# Patient Record
Sex: Female | Born: 1940 | Race: White | Hispanic: No | Marital: Married | Smoking: Never smoker
Health system: Southern US, Community
[De-identification: ages and names within clinical notes are randomized; demographics above are authoritative.]

## PROBLEM LIST (undated history)

## (undated) DIAGNOSIS — I10 Essential (primary) hypertension: Secondary | ICD-10-CM

## (undated) HISTORY — DX: Essential (primary) hypertension: I10

---

## 1997-10-30 ENCOUNTER — Ambulatory Visit: Admission: RE | Admit: 1997-10-30 | Discharge: 1997-10-30 | Payer: Self-pay | Admitting: Plastic Surgery

## 1998-09-14 HISTORY — PX: FOOT TENDON SURGERY: SHX958

## 1999-01-16 ENCOUNTER — Other Ambulatory Visit: Admission: RE | Admit: 1999-01-16 | Discharge: 1999-01-16 | Payer: Self-pay | Admitting: Obstetrics and Gynecology

## 2000-02-02 ENCOUNTER — Encounter: Payer: Self-pay | Admitting: Obstetrics and Gynecology

## 2000-02-02 ENCOUNTER — Encounter: Admission: RE | Admit: 2000-02-02 | Discharge: 2000-02-02 | Payer: Self-pay | Admitting: Obstetrics and Gynecology

## 2000-05-03 ENCOUNTER — Other Ambulatory Visit: Admission: RE | Admit: 2000-05-03 | Discharge: 2000-05-03 | Payer: Self-pay | Admitting: Obstetrics and Gynecology

## 2001-07-19 ENCOUNTER — Other Ambulatory Visit: Admission: RE | Admit: 2001-07-19 | Discharge: 2001-07-19 | Payer: Self-pay | Admitting: Obstetrics and Gynecology

## 2001-07-26 ENCOUNTER — Encounter: Admission: RE | Admit: 2001-07-26 | Discharge: 2001-07-26 | Payer: Self-pay | Admitting: Obstetrics and Gynecology

## 2001-07-26 ENCOUNTER — Encounter: Payer: Self-pay | Admitting: Obstetrics and Gynecology

## 2004-07-15 ENCOUNTER — Encounter: Admission: RE | Admit: 2004-07-15 | Discharge: 2004-07-15 | Payer: Self-pay | Admitting: Internal Medicine

## 2006-07-01 ENCOUNTER — Encounter: Admission: RE | Admit: 2006-07-01 | Discharge: 2006-07-01 | Payer: Self-pay | Admitting: Dermatology

## 2006-07-28 ENCOUNTER — Encounter: Admission: RE | Admit: 2006-07-28 | Discharge: 2006-07-28 | Payer: Self-pay | Admitting: Internal Medicine

## 2007-04-08 ENCOUNTER — Ambulatory Visit: Payer: Self-pay | Admitting: Internal Medicine

## 2007-05-23 ENCOUNTER — Ambulatory Visit: Payer: Self-pay | Admitting: Internal Medicine

## 2007-05-23 ENCOUNTER — Encounter: Payer: Self-pay | Admitting: Internal Medicine

## 2007-05-23 DIAGNOSIS — D126 Benign neoplasm of colon, unspecified: Secondary | ICD-10-CM

## 2007-12-29 DIAGNOSIS — K589 Irritable bowel syndrome without diarrhea: Secondary | ICD-10-CM

## 2009-04-12 ENCOUNTER — Encounter: Admission: RE | Admit: 2009-04-12 | Discharge: 2009-04-12 | Payer: Self-pay | Admitting: Internal Medicine

## 2010-12-24 ENCOUNTER — Other Ambulatory Visit: Payer: Self-pay | Admitting: Dermatology

## 2011-01-21 ENCOUNTER — Other Ambulatory Visit: Payer: Self-pay | Admitting: Dermatology

## 2011-01-27 NOTE — Assessment & Plan Note (Signed)
[facility]                         GASTROENTEROLOGY OFFICE NOTE   NAME:Victoria Hanna, Victoria Hanna                        MRN:          [id number]  DATE:04/08/2007                            DOB:          1941-09-05    REFERRING PHYSICIAN:  Loraine Leriche A. Perini, M.D.   OFFICE CONSULTATION NOTE   REASON FOR CONSULTATION:  Chronic abdominal complaints and screening  colonoscopy.   HISTORY:  This is a 70 year old white female with no significant past  medical history who was referred through the courtesy of Dr. Waynard Edwards  regarding chronic abdominal complaints and screening colonoscopy.  The  patient reports a history of irritable bowel syndrome.  She describes  intolerance to lactose and wheat products.  With lactose products, she  describes significant abdominal discomfort and bloating.  She denies  significant constipation or diarrhea, though she has a tendency towards  loose stools.  She also feels that she is intolerant to wheat.  She has  not been formally tested to exclude celiac disease with similar-type  symptoms.  No nausea or vomiting, melena, hematochezia, or weight loss.   PAST MEDICAL HISTORY:  None.   PAST SURGICAL HISTORY:  Left foot surgery.   ALLERGIES:  No known drug allergies.   CURRENT MEDICATIONS:  1. Multivitamin.  2. Cosamin DS daily.  3. Tylenol p.r.n.  4. Lactaid p.r.n.   FAMILY HISTORY:  Negative for gastrointestinal malignancy.   SOCIAL HISTORY:  The patient is married with 2 children.  She has  college degree.  She works in Airline pilot for [employer].  Does not  smoke.  Drinks 3 to 4 glasses of red wine per week.   REVIEW OF SYSTEMS:  Per diagnostic evaluation form.   PHYSICAL EXAMINATION:  Well-appearing female in no acute distress.  Blood pressure is 120/72, heart rate is 64, weight is 128 pounds.  She  is 5 feet 2 inches in height.  HEENT:  Sclerae are anicteric.  Conjunctivae pink.  Oral mucosa is  intact.  No  adenopathy.  LUNGS:  Clear.  HEART:  Regular.  ABDOMEN:  Soft without tenderness, mass, or hernia.  Good bowel sounds  heard.  EXTREMITIES:  Without edema.   LABORATORY:  The laboratories from Dr. Laurey Morale office March 07, 2007  revealed mild elevation of hepatic transaminases.  CBC was normal.  Cholesterol was mildly elevated.   IMPRESSION:  This is a 70 year old female with no significant past  medical history who presented today regarding chronic stable abdominal  complaints and screening colonoscopy.  With regard to her abdominal  complaints, I agree that she has an irritable bowel-type picture, though  she may indeed have a component of lactose intolerance.  She should also  be screened for celiac sprue.  No worrisome feature to her history  otherwise.  In terms of colonoscopy, she is an appropriate candidate  without contraindication.   RECOMMENDATIONS:  1. Celiac sprue panel.  2. Colonoscopy.  The nature of the procedure as well as the risks,      benefits, and alternatives were discussed in detail.  She  understood and agreed to proceed.  She preferred the OsmoPrep.  I      advised her with regards to the importance of vigorous hydration.     Wilhemina Bonito. Marina Goodell, MD  Electronically Signed    JNP/MedQ  DD: 04/08/2007  DT: 04/09/2007  Job #: [id number]   cc:   Loraine Leriche A. Perini, M.D.

## 2011-04-01 ENCOUNTER — Other Ambulatory Visit: Payer: Self-pay | Admitting: Dermatology

## 2011-05-06 ENCOUNTER — Other Ambulatory Visit: Payer: Self-pay | Admitting: Dermatology

## 2011-05-28 ENCOUNTER — Other Ambulatory Visit: Payer: Self-pay | Admitting: Dermatology

## 2012-03-29 ENCOUNTER — Other Ambulatory Visit (HOSPITAL_COMMUNITY): Payer: Self-pay | Admitting: Orthopedic Surgery

## 2012-03-29 DIAGNOSIS — S92902A Unspecified fracture of left foot, initial encounter for closed fracture: Secondary | ICD-10-CM

## 2012-04-04 ENCOUNTER — Other Ambulatory Visit (HOSPITAL_COMMUNITY): Payer: Self-pay

## 2012-04-04 ENCOUNTER — Encounter (HOSPITAL_COMMUNITY): Payer: Self-pay

## 2012-04-07 ENCOUNTER — Encounter (HOSPITAL_COMMUNITY)
Admission: RE | Admit: 2012-04-07 | Discharge: 2012-04-07 | Disposition: A | Discharge status: Home / Self Care | Payer: Medicare Other | Source: Ambulatory Visit | Attending: Orthopedic Surgery | Admitting: Orthopedic Surgery

## 2012-04-07 DIAGNOSIS — L97509 Non-pressure chronic ulcer of other part of unspecified foot with unspecified severity: Secondary | ICD-10-CM | POA: Insufficient documentation

## 2012-04-07 DIAGNOSIS — S92902A Unspecified fracture of left foot, initial encounter for closed fracture: Secondary | ICD-10-CM

## 2012-04-07 MED ORDER — TECHNETIUM TC 99M MEDRONATE IV KIT
24.0000 | PACK | Freq: Once | INTRAVENOUS | Status: AC | PRN
  Administered 2012-04-07: 24 via INTRAVENOUS

## 2012-05-19 ENCOUNTER — Ambulatory Visit: Payer: Medicare Other | Admitting: Infectious Disease

## 2012-10-25 ENCOUNTER — Other Ambulatory Visit: Payer: Self-pay | Admitting: Internal Medicine

## 2012-10-25 DIAGNOSIS — N949 Unspecified condition associated with female genital organs and menstrual cycle: Secondary | ICD-10-CM

## 2012-11-08 ENCOUNTER — Other Ambulatory Visit: Payer: Medicare Other

## 2012-11-15 ENCOUNTER — Ambulatory Visit
Admission: RE | Admit: 2012-11-15 | Discharge: 2012-11-15 | Disposition: A | Discharge status: Home / Self Care | Payer: Medicare Other | Source: Ambulatory Visit | Attending: Internal Medicine | Admitting: Internal Medicine

## 2013-05-31 ENCOUNTER — Other Ambulatory Visit: Payer: Self-pay | Admitting: Dermatology

## 2013-08-15 ENCOUNTER — Other Ambulatory Visit: Payer: Self-pay

## 2013-08-15 DIAGNOSIS — Z1231 Encounter for screening mammogram for malignant neoplasm of breast: Secondary | ICD-10-CM

## 2013-09-19 ENCOUNTER — Ambulatory Visit
Admission: RE | Admit: 2013-09-19 | Discharge: 2013-09-19 | Disposition: A | Discharge status: Home / Self Care | Payer: Medicare Other | Source: Ambulatory Visit

## 2013-09-19 DIAGNOSIS — Z1231 Encounter for screening mammogram for malignant neoplasm of breast: Secondary | ICD-10-CM

## 2015-01-31 ENCOUNTER — Other Ambulatory Visit: Payer: Self-pay

## 2015-01-31 DIAGNOSIS — Z1231 Encounter for screening mammogram for malignant neoplasm of breast: Secondary | ICD-10-CM

## 2015-03-05 ENCOUNTER — Ambulatory Visit
Admission: RE | Admit: 2015-03-05 | Discharge: 2015-03-05 | Disposition: A | Discharge status: Home / Self Care | Payer: Medicare Other | Source: Ambulatory Visit

## 2015-03-05 DIAGNOSIS — Z1231 Encounter for screening mammogram for malignant neoplasm of breast: Secondary | ICD-10-CM

## 2016-04-14 ENCOUNTER — Other Ambulatory Visit: Payer: Self-pay | Admitting: Internal Medicine

## 2016-04-14 DIAGNOSIS — Z1231 Encounter for screening mammogram for malignant neoplasm of breast: Secondary | ICD-10-CM

## 2016-06-09 ENCOUNTER — Ambulatory Visit
Admission: RE | Admit: 2016-06-09 | Discharge: 2016-06-09 | Disposition: A | Discharge status: Home / Self Care | Payer: Medicare Other | Source: Ambulatory Visit | Attending: Internal Medicine | Admitting: Internal Medicine

## 2016-06-09 DIAGNOSIS — Z1231 Encounter for screening mammogram for malignant neoplasm of breast: Secondary | ICD-10-CM

## 2017-06-21 ENCOUNTER — Encounter: Payer: Self-pay | Admitting: Internal Medicine

## 2017-12-13 ENCOUNTER — Other Ambulatory Visit: Payer: Self-pay | Admitting: Internal Medicine

## 2017-12-13 DIAGNOSIS — Z1231 Encounter for screening mammogram for malignant neoplasm of breast: Secondary | ICD-10-CM

## 2018-01-18 ENCOUNTER — Ambulatory Visit
Admission: RE | Admit: 2018-01-18 | Discharge: 2018-01-18 | Disposition: A | Discharge status: Home / Self Care | Payer: Medicare Other | Source: Ambulatory Visit | Attending: Internal Medicine | Admitting: Internal Medicine

## 2018-01-18 DIAGNOSIS — Z1231 Encounter for screening mammogram for malignant neoplasm of breast: Secondary | ICD-10-CM

## 2018-01-19 ENCOUNTER — Other Ambulatory Visit: Payer: Self-pay | Admitting: Internal Medicine

## 2018-01-19 DIAGNOSIS — R928 Other abnormal and inconclusive findings on diagnostic imaging of breast: Secondary | ICD-10-CM

## 2018-01-25 ENCOUNTER — Ambulatory Visit
Admission: RE | Admit: 2018-01-25 | Discharge: 2018-01-25 | Disposition: A | Discharge status: Home / Self Care | Payer: Medicare Other | Source: Ambulatory Visit | Attending: Internal Medicine | Admitting: Internal Medicine

## 2018-01-25 ENCOUNTER — Other Ambulatory Visit: Payer: Self-pay | Admitting: Internal Medicine

## 2018-01-25 DIAGNOSIS — R928 Other abnormal and inconclusive findings on diagnostic imaging of breast: Secondary | ICD-10-CM

## 2018-01-25 DIAGNOSIS — N6320 Unspecified lump in the left breast, unspecified quadrant: Secondary | ICD-10-CM

## 2018-01-28 ENCOUNTER — Other Ambulatory Visit: Payer: Self-pay | Admitting: Internal Medicine

## 2018-01-28 DIAGNOSIS — N6320 Unspecified lump in the left breast, unspecified quadrant: Secondary | ICD-10-CM

## 2018-01-31 ENCOUNTER — Ambulatory Visit
Admission: RE | Admit: 2018-01-31 | Discharge: 2018-01-31 | Disposition: A | Discharge status: Home / Self Care | Payer: Medicare Other | Source: Ambulatory Visit | Attending: Internal Medicine | Admitting: Internal Medicine

## 2018-01-31 DIAGNOSIS — N6320 Unspecified lump in the left breast, unspecified quadrant: Secondary | ICD-10-CM

## 2018-02-01 ENCOUNTER — Telehealth: Payer: Self-pay | Admitting: Hematology

## 2018-02-01 NOTE — Telephone Encounter (Signed)
Spoke with patient to confirm afternoon [facility] appointment for 5/29, packet will be emailed to patient

## 2018-02-03 ENCOUNTER — Encounter: Payer: Self-pay | Admitting: *Deleted

## 2018-02-04 ENCOUNTER — Other Ambulatory Visit: Payer: Self-pay | Admitting: *Deleted

## 2018-02-04 DIAGNOSIS — Z170 Estrogen receptor positive status [ER+]: Principal | ICD-10-CM

## 2018-02-04 DIAGNOSIS — C50212 Malignant neoplasm of upper-inner quadrant of left female breast: Secondary | ICD-10-CM

## 2018-02-08 ENCOUNTER — Other Ambulatory Visit: Payer: Self-pay | Admitting: General Surgery

## 2018-02-08 DIAGNOSIS — C50212 Malignant neoplasm of upper-inner quadrant of left female breast: Secondary | ICD-10-CM

## 2018-02-08 DIAGNOSIS — Z170 Estrogen receptor positive status [ER+]: Principal | ICD-10-CM

## 2018-02-08 NOTE — Progress Notes (Signed)
[place]  Telephone:([phone] Fax:([phone]) [place] Note   Patient Care Team: Crist Infante, MD as PCP - General (Internal Medicine) Eppie Gibson, MD as Attending Physician (Radiation Oncology) Truitt Merle, MD as Consulting Physician (Hematology)   Date of Service:   02/09/2018  CHIEF COMPLAINTS/PURPOSE OF CONSULTATION:  Malignant neoplasm of upper-inner quadrant of left breast   Oncology History   Cancer Staging Malignant neoplasm of upper-inner quadrant of left breast in female, estrogen receptor positive ([place]) Staging form: Breast, AJCC 8th Edition - Clinical stage from 01/31/2018: Stage IA (cT1c, cN0, cM0, G1, ER+, PR+, HER2-) - Signed by Truitt Merle, MD on 02/09/2018       Malignant neoplasm of upper-inner quadrant of left breast in female, estrogen receptor positive ([place])   01/25/2018 Mammogram    Diagnostic Mammogram 01/25/18  IMPRESSION:  Suspicious irregular hypoechoic mass in the left breast at 11:30 5 cm from the nipple measuring 1.2 x 1.1 x 1.1 cm.    RECOMMENDATION: Ultrasound-guided core biopsy of the mass in the 11:30 region of the left breast is recommended. The ultrasound-guided core biopsy will be scheduled at the patient's convenience       01/31/2018 Initial Biopsy     Diagnosis 01/31/18  Breast, left, needle core biopsy, 11:30 o'clock - INVASIVE DUCTAL CARICNOMA. - SEE COMMENT. Microscopic Comment The carcinoma appears grade I. A breast prognostic profile will be performed and the results reported separately. The results were called to [facility] on 02/01/18. (JBK:gt, 02/01/18)       01/31/2018 Receptors her2    HER2 Negative ER 100% positive PR 70% positive  Ki67 15%      01/31/2018 Cancer Staging    Staging form: Breast, AJCC 8th Edition - Clinical stage from 01/31/2018: Stage IA (cT1c, cN0, cM0, G1, ER+, PR+, HER2-) - Signed by Truitt Merle, MD on 02/09/2018      02/04/2018 Initial  Diagnosis    Malignant neoplasm of upper-inner quadrant of left breast in female, estrogen receptor positive ([place])       HISTORY OF PRESENTING ILLNESS: 02/09/18  Victoria Hanna 77 y.o. female is a here because of newly diagnosed left breast cancer. The patient presents to the Breast Clinic today by herself.   This was discovered by screening mammogram. She did not feel the lump herself. She has lumpy breast tissues and may often get called back for follow up scans, however she did not have a biopsy prior to this. She denies breast change, nipple bleeding or discharge.    Today she notes she was contacted over the phone yesterday in [place] and asked questions regarding her diagnosis and current health. She over all feels well and healthy.  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 She is very active. She has 2 daughters. She notes she is divorced. Her first husband died in his 46s from colon cancer. She drinks wine moderately. She is a non-smoker.   She is overall very healthy. She had a tendon separate in her left foot in the past and had 1 surgery to repair this at [facility] in 2000. Her daughter had breast cancer at age 67 in [place] and was treated and cured. Patient keeps up with her regular screenings. She takes Micardis for HTN. She had menopause in her late 53s with little difficulty and was complete at 47. She took hormonal replacements before she started menopause.    GYN HISTORY  Menarchal: 12/13 LMP:  late 72s to age 30yo Contraceptive: NA HRT: 7-8 years, started before menopause G2P2: 2 daughters, breast feeding was short term    MEDICAL HISTORY:  Past Medical History:  Diagnosis Date  . Hypertension     SURGICAL HISTORY: Past Surgical History:  Procedure Laterality Date  . FOOT TENDON SURGERY Left 2000    SOCIAL HISTORY: Social History   Socioeconomic History  . Marital status: Divorced    Spouse name: Not on file  . Number of children: Not on file    . Years of education: Not on file  . Highest education level: Not on file  Occupational History  . Not on file  Social Needs  . Financial resource strain: Not on file  . Food insecurity:    Worry: Not on file    Inability: Not on file  . Transportation needs:    Medical: Not on file    Non-medical: Not on file  Tobacco Use  . Smoking status: Never Smoker  . Smokeless tobacco: Never Used  Substance and Sexual Activity  . Alcohol use: Yes    Alcohol/week: 3.0 oz    Types: 5 Glasses of wine per week  . Drug use: Not on file  . Sexual activity: Not on file  Lifestyle  . Physical activity:    Days per week: Not on file    Minutes per session: Not on file  . Stress: Not on file  Relationships  . Social connections:    Talks on phone: Not on file    Gets together: Not on file    Attends religious service: Not on file    Active member of club or organization: Not on file    Attends meetings of clubs or organizations: Not on file    Relationship status: Not on file  . Intimate partner violence:    Fear of current or ex partner: Not on file    Emotionally abused: Not on file    Physically abused: Not on file    Forced sexual activity: Not on file  Other Topics Concern  . Not on file  Social History Narrative  . Not on file    FAMILY HISTORY: Family History  Problem Relation Age of Onset  . Breast cancer Daughter 54    ALLERGIES:  has No Known Allergies.  MEDICATIONS:  Current Outpatient Medications  Medication Sig Dispense Refill  . telmisartan (MICARDIS) 20 MG tablet Take 10 mg by mouth daily.     No current facility-administered medications for this visit.     REVIEW OF SYSTEMS:   Constitutional: Denies fevers, chills or abnormal night sweats Eyes: Denies blurriness of vision, double vision or watery eyes Ears, nose, mouth, throat, and face: Denies mucositis or sore throat Respiratory: Denies cough, dyspnea or wheezes Cardiovascular: Denies palpitation,  chest discomfort or lower extremity swelling Gastrointestinal: Denies nausea, heartburn or change in bowel habits Skin: Denies abnormal skin rashes Lymphatics: Denies new lymphadenopathy or easy bruising Neurological:Denies numbness, tingling or new weaknesses Breast: (+) Mild left breast tenderness from biopsy Behavioral/Psych: Mood is stable, no new changes  All other systems were reviewed with the patient and are negative.  PHYSICAL EXAMINATION: ECOG PERFORMANCE STATUS: 0 - Asymptomatic  Vitals:   02/09/18 1334  BP: (!) 150/75  Pulse: 71  Resp: 18  Temp: 98.1 F (36.7 C)  SpO2: 100%   Filed Weights   02/09/18 1334  Weight: 115 lb 9.6 oz (52.4 kg)    GENERAL:alert, no distress and comfortable SKIN:  skin color, texture, turgor are normal, no rashes or significant lesions EYES: normal, conjunctiva are pink and non-injected, sclera clear OROPHARYNX:no exudate, no erythema and lips, buccal mucosa, and tongue normal  NECK: supple, thyroid normal size, non-tender, without nodularity LYMPH:  no palpable lymphadenopathy in the cervical, axillary or inguinal LUNGS: clear to auscultation and percussion with normal breathing effort HEART: regular rate & rhythm and no murmurs and no lower extremity edema ABDOMEN:abdomen soft, non-tender and normal bowel sounds Musculoskeletal:no cyanosis of digits and no clubbing  PSYCH: alert & oriented x 3 with fluent speech NEURO: no focal motor/sensory deficits BREAST: (+) mild skin ecchymosis at biopsy site, mild tenderness. No palpable lump or adenopathy of left or right breast or axilla   LABORATORY DATA:  I have reviewed the data as listed CBC Latest Ref Rng & Units 02/09/2018  WBC 3.9 - 10.3 K/uL 7.9  Hemoglobin 11.6 - 15.9 g/dL 14.4  Hematocrit 34.8 - 46.6 % 46.0  Platelets 145 - 400 K/uL 354    CMP Latest Ref Rng & Units 02/09/2018  Glucose 70 - 140 mg/dL 112  BUN 7 - 26 mg/dL 15  Creatinine 0.60 - 1.10 mg/dL 0.84  Sodium 136 - 145  mmol/L 140  Potassium 3.5 - 5.1 mmol/L 4.6  Chloride 98 - 109 mmol/L 103  CO2 22 - 29 mmol/L 28  Calcium 8.4 - 10.4 mg/dL 9.7  Total Protein 6.4 - 8.3 g/dL 7.3  Total Bilirubin 0.2 - 1.2 mg/dL 0.5  Alkaline Phos 40 - 150 U/L 87  AST 5 - 34 U/L 25  ALT 0 - 55 U/L 20    PATHOLOGY  Diagnosis 01/31/18  Breast, left, needle core biopsy, 11:30 o'clock - INVASIVE DUCTAL CARICNOMA. - SEE COMMENT. Microscopic Comment The carcinoma appears grade I. A breast prognostic profile will be performed and the results reported separately. The results were called to [facility] on 02/01/18. (JBK:gt, 02/01/18) ADDITIONAL INFORMATION: PROGNOSTIC INDICATORS Results: IMMUNOHISTOCHEMICAL AND MORPHOMETRIC ANALYSIS PERFORMED MANUALLY Estrogen Receptor: 100%, POSITIVE, STRONG STAINING INTENSITY Progesterone Receptor: 70%, POSITIVE, STRONG STAINING INTENSITY Proliferation Marker Ki67: 15% REFERENCE RANGE ESTROGEN RECEPTOR NEGATIVE 0% POSITIVE =>1% REFERENCE RANGE PROGESTERONE RECEPTOR NEGATIVE 0% POSITIVE =>1% All controls stained appropriately Claudette Laws MD Pathologist, Electronic Signature ( Signed 02/03/2018) FLUORESCENCE IN-SITU HYBRIDIZATION Results: HER2 - NEGATIVE RATIO OF HER2/CEP17 SIGNALS 1.13 AVERAGE HER2 COPY NUMBER PER CELL 1.80 Reference Range: NEGATIVE HER2/CEP17 Ratio <2.0 and average HER2 copy number <4.0 EQUIVOCAL HER2/CEP17 Ratio <2.0 and average HER2 copy number >=4.0 and <6.0 1 of 3 FINAL for Victoria Hanna, Victoria Hanna ([id number]) ADDITIONAL INFORMATION:(continued) POSITIVE HER2/CEP17 Ratio >=2.0 or <2.0 and average HER2 copy number >=6.0   RADIOGRAPHIC STUDIES: I have personally reviewed the radiological images as listed and agreed with the findings in the report. US Breast Ltd Uni Left Inc Axilla  Result Date: 01/25/2018 CLINICAL DATA:  Patient was called back from screening mammogram for a possible mass in the left breast. EXAM: DIGITAL  DIAGNOSTIC LEFT MAMMOGRAM WITH TOMO ULTRASOUND LEFT BREAST COMPARISON:  Previous exam(s). ACR Breast Density Category c: The breast tissue is heterogeneously dense, which may obscure small masses. FINDINGS: Additional imaging of the left breast was performed. There is a 1.4 cm spiculated mass in the 11-12 o'clock region of the breast. On physical exam, I palpate focal thickening in the left breast 11:30 5 cm from the nipple. Targeted ultrasound is performed, showing an irregular hypoechoic mass in the left breast at 11:30 5 cm from the nipple measuring 1.2  x 1.1 x 1.1 cm. Sonographic evaluation the left axilla does not show any enlarged adenopathy. IMPRESSION: Suspicious spiculated mass in the 11:30 region of the left breast. RECOMMENDATION: Ultrasound-guided core biopsy of the mass in the 11:30 region of the left breast is recommended. The ultrasound-guided core biopsy will be scheduled at the patient's convenience. I have discussed the findings and recommendations with the patient. Results were also provided in writing at the conclusion of the visit. If applicable, a reminder letter will be sent to the patient regarding the next appointment. BI-RADS CATEGORY  5: Highly suggestive of malignancy. Electronically Signed   By: Lillia Mountain M.D.   On: 01/25/2018 10:33   Mm Diag Breast Tomo Uni Left  Result Date: 01/25/2018 CLINICAL DATA:  Patient was called back from screening mammogram for a possible mass in the left breast. EXAM: DIGITAL DIAGNOSTIC LEFT MAMMOGRAM WITH TOMO ULTRASOUND LEFT BREAST COMPARISON:  Previous exam(s). ACR Breast Density Category c: The breast tissue is heterogeneously dense, which may obscure small masses. FINDINGS: Additional imaging of the left breast was performed. There is a 1.4 cm spiculated mass in the 11-12 o'clock region of the breast. On physical exam, I palpate focal thickening in the left breast 11:30 5 cm from the nipple. Targeted ultrasound is performed, showing an irregular  hypoechoic mass in the left breast at 11:30 5 cm from the nipple measuring 1.2 x 1.1 x 1.1 cm. Sonographic evaluation the left axilla does not show any enlarged adenopathy. IMPRESSION: Suspicious spiculated mass in the 11:30 region of the left breast. RECOMMENDATION: Ultrasound-guided core biopsy of the mass in the 11:30 region of the left breast is recommended. The ultrasound-guided core biopsy will be scheduled at the patient's convenience. I have discussed the findings and recommendations with the patient. Results were also provided in writing at the conclusion of the visit. If applicable, a reminder letter will be sent to the patient regarding the next appointment. BI-RADS CATEGORY  5: Highly suggestive of malignancy. Electronically Signed   By: Lillia Mountain M.D.   On: 01/25/2018 10:33   Mm 3d Screen Breast Bilateral  Result Date: 01/18/2018 CLINICAL DATA:  Screening. EXAM: DIGITAL SCREENING BILATERAL MAMMOGRAM WITH TOMO AND CAD COMPARISON:  Previous exam(s). ACR Breast Density Category c: The breast tissue is heterogeneously dense, which may obscure small masses. FINDINGS: In the left breast, a possible mass warrants further evaluation. In the right breast, no findings suspicious for malignancy. Images were processed with CAD. IMPRESSION: Further evaluation is suggested for possible mass in the left breast. RECOMMENDATION: Diagnostic mammogram and possibly ultrasound of the left breast. (Code:[id number]) The patient will be contacted regarding the findings, and additional imaging will be scheduled. BI-RADS CATEGORY  0: Incomplete. Need additional imaging evaluation and/or prior mammograms for comparison. Electronically Signed   By: Lajean Manes M.D.   On: 01/18/2018 11:55   Mm Clip Placement Left  Result Date: 01/31/2018 CLINICAL DATA:  Status post ultrasound-guided core biopsy of a left breast mass. EXAM: DIAGNOSTIC LEFT MAMMOGRAM POST ULTRASOUND BIOPSY COMPARISON:  Previous exam(s). FINDINGS:  Mammographic images were obtained following ultrasound guided biopsy of a mass in the 11:30 region of the left breast. Mammographic images show there is a ribbon shaped clip in the upper-inner quadrant of the left breast in appropriate position. IMPRESSION: Status post ultrasound-guided core biopsy of a left breast mass with pathology pending. Final Assessment: Post Procedure Mammograms for Marker Placement Electronically Signed   By: Lillia Mountain M.D.   On: 01/31/2018 14:50  [place] Lt Breast Bx W Loc Dev 1st Lesion Img Bx Spec US Guide  Addendum Date: 02/02/2018   ADDENDUM REPORT: 02/01/2018 14:53 ADDENDUM: Pathology revealed GRADE I INVASIVE DUCTAL CARCINOMA of LEFT breast, 11:30 o'clock. This was found to be concordant by Dr. Lillia Mountain. Pathology results were discussed with the patient by telephone. The patient reported doing well after the biopsy with tenderness at the site. Post biopsy instructions and care were reviewed and questions were answered. The patient was encouraged to call [facility] for any additional concerns. The patient was referred to [facility] at [facility] on Feb 09, 2018. Pathology results reported by Roselind Messier, RN on 02/01/2018. Electronically Signed   By: Lillia Mountain M.D.   On: 02/01/2018 14:53   Result Date: 02/02/2018 CLINICAL DATA:  Left breast mass. EXAM: ULTRASOUND GUIDED LEFT BREAST CORE NEEDLE BIOPSY COMPARISON:  Previous exam(s). FINDINGS: I met with the patient and we discussed the procedure of ultrasound-guided biopsy, including benefits and alternatives. We discussed the high likelihood of a successful procedure. We discussed the risks of the procedure, including infection, bleeding, tissue injury, clip migration, and inadequate sampling. Informed written consent was given. The usual time-out protocol was performed immediately prior to the procedure. Lesion quadrant: Upper inner  quadrant. Using sterile technique and 1% Lidocaine as local anesthetic, under direct ultrasound visualization, a 12 gauge spring-loaded device was used to perform biopsy of a mass in the 11:30 region of the left breast using a lateral to medial approach. At the conclusion of the procedure a ribbon shaped tissue marker clip was deployed into the biopsy cavity. Follow up 2 view mammogram was performed and dictated separately. IMPRESSION: Ultrasound guided biopsy of the left breast. No apparent complications. Electronically Signed: By: Lillia Mountain M.D. On: 01/31/2018 14:33    ASSESSMENT & PLAN:  Victoria Hanna is a 77 y.o. Caucasian female with no significant medical history other than HTN, on Micardis.    1. Malignant neoplasm of upper-inner quadrant of left breast, Stage IA, c ( ), ER/PR Positive, HER2 Negative, Grade I --We discussed her imaging findings and the biopsy results in great details. She has grade I invasive ductal carcinoma.  -Giving the early stage disease, she'll likely need a lumpectomy with without (due to her age) sentinel lymph node biopsy. She is agreeable with that. She was seen by Dr. Barry Dienes today and likely will proceed with surgery soon.  -If her surgical pathology shows the tumor is more than 1 cm, I recommend a Oncotype Dx test on the surgical sample and we'll make a decision about adjuvant chemotherapy based on the Oncotype result. Written material of this test was given to her.  -If her surgical sentinel lymph node positive, I recommend mammaprint for further risk stratification and guide adjuvant chemotherapy. -I have low suspicion she has high risk disease due to her strongly ER and PR positive, HER-2 negative disease, and low-grade disease.  Due to her advanced age I would likely not recommend chemo if intermediate risk. She would like to think about this test. -The risk of recurrence depends on the stage and biology of the tumor. She is early stage, with ER/PR positive  and HER2 Negative markers. I discussed this is the more common type of slow growing tumor.  -She was also seen by radiation oncologist Dr. Isidore Moos today. If her surgical sentinel lymph nodes were negative, she would not need post mastectomy radiation.  -  Giving the strong ER and PR expression in her postmenopausal status, I recommend adjuvant endocrine therapy with aromatase inhibitor for a total of 5-10 years to reduce the risk of cancer recurrence. Potential benefits and side effects were discussed with patient and she is interested. -We also discussed the breast cancer surveillance after her surgery. She will continue annual screening mammogram, self exam, and a routine office visit with lab and exam with [place]. -I encouraged her to have healthy diet and exercise regularly -Plan to see her after her surgery and possible radiation.    2. Genetic Testing  -Based on her daughter's early breast cancer and patient's new breast cancer diagnosis, she is eligible for genetic testing. I discussed if she is found positive I would encourage her other daughter to be tested. She will think about this.   3. HTN -Continue medication, follow-up with her primary care physician   PLAN:  -She will proceed with lumpectomy by Dr. Barry Dienes soon  -Plan to send Oncotype if her tumor is more than 1 cm  -plan to see her after her adjuvant radiatio, if she has Oncotype high risk disease.    All questions were answered. The patient knows to call the clinic with any problems, questions or concerns. I spent 45 minutes counseling the patient face to face. The total time spent in the appointment was 60 minutes and more than 50% was on counseling.     Truitt Merle, MD 02/09/2018 10:53 PM  I, Joslyn Devon, am acting as scribe for Truitt Merle, MD.    I have reviewed the above documentation for accuracy and completeness, and I agree with the above.

## 2018-02-09 ENCOUNTER — Encounter: Payer: Self-pay | Admitting: Physical Therapy

## 2018-02-09 ENCOUNTER — Ambulatory Visit: Payer: Medicare Other | Attending: General Surgery | Admitting: Physical Therapy

## 2018-02-09 ENCOUNTER — Ambulatory Visit
Admission: RE | Admit: 2018-02-09 | Discharge: 2018-02-09 | Disposition: A | Discharge status: Home / Self Care | Payer: Medicare Other | Source: Ambulatory Visit | Attending: Radiation Oncology | Admitting: Radiation Oncology

## 2018-02-09 ENCOUNTER — Inpatient Hospital Stay: Payer: Medicare Other | Attending: Hematology | Admitting: Hematology

## 2018-02-09 ENCOUNTER — Other Ambulatory Visit: Payer: Self-pay

## 2018-02-09 ENCOUNTER — Encounter: Payer: Self-pay | Admitting: Radiation Oncology

## 2018-02-09 ENCOUNTER — Inpatient Hospital Stay: Payer: Medicare Other

## 2018-02-09 ENCOUNTER — Encounter: Payer: Self-pay | Admitting: Hematology

## 2018-02-09 VITALS — BP 150/75 | HR 71 | Temp 98.1°F | Resp 18 | Ht 63.0 in | Wt 115.6 lb

## 2018-02-09 DIAGNOSIS — C50212 Malignant neoplasm of upper-inner quadrant of left female breast: Secondary | ICD-10-CM | POA: Insufficient documentation

## 2018-02-09 DIAGNOSIS — Z79899 Other long term (current) drug therapy: Secondary | ICD-10-CM | POA: Insufficient documentation

## 2018-02-09 DIAGNOSIS — I10 Essential (primary) hypertension: Secondary | ICD-10-CM | POA: Insufficient documentation

## 2018-02-09 DIAGNOSIS — Z170 Estrogen receptor positive status [ER+]: Secondary | ICD-10-CM | POA: Insufficient documentation

## 2018-02-09 DIAGNOSIS — Z803 Family history of malignant neoplasm of breast: Secondary | ICD-10-CM | POA: Diagnosis not present

## 2018-02-09 DIAGNOSIS — R293 Abnormal posture: Secondary | ICD-10-CM | POA: Diagnosis present

## 2018-02-09 LAB — CBC WITH DIFFERENTIAL (CANCER CENTER ONLY)
BASOS ABS: 0 10*3/uL (ref 0.0–0.1)
BASOS PCT: 1 %
Eosinophils Absolute: 0.2 10*3/uL (ref 0.0–0.5)
Eosinophils Relative: 2 %
HEMATOCRIT: 46 % (ref 34.8–46.6)
HEMOGLOBIN: 14.4 g/dL (ref 11.6–15.9)
Lymphocytes Relative: 19 %
Lymphs Abs: 1.5 10*3/uL (ref 0.9–3.3)
MCH: 29.9 pg (ref 25.1–34.0)
MCHC: 31.3 g/dL — ABNORMAL LOW (ref 31.5–36.0)
MCV: 95.4 fL (ref 79.5–101.0)
Monocytes Absolute: 0.9 10*3/uL (ref 0.1–0.9)
Monocytes Relative: 11 %
NEUTROS PCT: 67 %
Neutro Abs: 5.4 10*3/uL (ref 1.5–6.5)
Platelet Count: 354 10*3/uL (ref 145–400)
RBC: 4.82 MIL/uL (ref 3.70–5.45)
RDW: 13 % (ref 11.2–14.5)
WBC Count: 7.9 10*3/uL (ref 3.9–10.3)

## 2018-02-09 LAB — CMP (CANCER CENTER ONLY)
ALK PHOS: 87 U/L (ref 40–150)
ALT: 20 U/L (ref 0–55)
ANION GAP: 9 (ref 3–11)
AST: 25 U/L (ref 5–34)
Albumin: 4 g/dL (ref 3.5–5.0)
BILIRUBIN TOTAL: 0.5 mg/dL (ref 0.2–1.2)
BUN: 15 mg/dL (ref 7–26)
CALCIUM: 9.7 mg/dL (ref 8.4–10.4)
CO2: 28 mmol/L (ref 22–29)
Chloride: 103 mmol/L (ref 98–109)
Creatinine: 0.84 mg/dL (ref 0.60–1.10)
Glucose, Bld: 112 mg/dL (ref 70–140)
Potassium: 4.6 mmol/L (ref 3.5–5.1)
Sodium: 140 mmol/L (ref 136–145)
TOTAL PROTEIN: 7.3 g/dL (ref 6.4–8.3)

## 2018-02-09 NOTE — Patient Instructions (Signed)

## 2018-02-09 NOTE — Therapy (Signed)
[place], [place], [address] Phone: [phone]   Fax:  [phone]  Physical Therapy Evaluation  Patient Details  Name: Victoria Hanna MRN: [id number] Date of Birth: Mar 11, 1941 Referring Provider: Dr. [name]   Encounter Date: 02/09/2018  PT End of Session - 02/09/18 1609    Visit Number  1    Number of Visits  2    Date for PT Re-Evaluation  04/06/18    PT Start Time  8453    PT Stop Time  1430 Also saw pt from 1510-1522 for a total of 30 minutes    PT Time Calculation (min)  18 min    Activity Tolerance  Patient tolerated treatment well    Behavior During Therapy  [facility] for tasks assessed/performed       Past Medical History:  Diagnosis Date  . Hypertension     Past Surgical History:  Procedure Laterality Date  . FOOT TENDON SURGERY Left 2000    There were no vitals filed for this visit.   Subjective Assessment - 02/09/18 1534    Subjective  Patient reports she is here today to be seen by her medical team for her newly diagnosed left breast cancer.    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 It measures 1.2 cm and is located in the upper outer quadrant. It is ER/PR positive and HER2 negative with a Ki67 of 15%. She has no other medical problems.    Patient Stated Goals  reduce lymphedema risk and learn post op shoulder ROM HEP    Currently in Pain?  No/denies         [facility] PT Assessment - 02/09/18 0001      Assessment   Medical Diagnosis  Left breast cancer    Referring Provider  Dr. [name]    Onset Date/Surgical Date  01/18/18    Hand Dominance  Right    Prior Therapy  none      Precautions   Precautions  Other (comment)      Restrictions   Weight Bearing Restrictions  No      Balance Screen   Has the patient fallen in the past 6 months  No    Has the patient had a decrease in activity  level because of a fear of falling?   No    Is the patient reluctant to leave their home because of a fear of falling?   No      Home Environment   Living Environment  Private residence    Living Arrangements  Alone    Available Help at Discharge  Friend(s)      Prior Function   Level of Independence  Independent    Vocation  Full time employment    Vocation Requirements  Real estate agent    Leisure  She does Pilates 3x/week, walks 2x.week for 30 min, and does some mat exercises      Cognition   Overall Cognitive Status  Within Functional Limits for tasks assessed      Posture/Postural Control   Posture/Postural Control  Postural limitations    Postural Limitations  Rounded Shoulders;Forward head      ROM / Strength   AROM / PROM / Strength  AROM;Strength      AROM   AROM Assessment Site  Shoulder;Cervical    Right/Left Shoulder  Right;Left    Right Shoulder Extension  41 Degrees    Right Shoulder Flexion  142 Degrees    Right Shoulder ABduction  146 Degrees    Right Shoulder Internal Rotation  55 Degrees    Right Shoulder External Rotation  61 Degrees    Left Shoulder Extension  53 Degrees    Left Shoulder Flexion  152 Degrees    Left Shoulder ABduction  161 Degrees    Left Shoulder Internal Rotation  63 Degrees    Left Shoulder External Rotation  72 Degrees    Cervical Flexion  WNL    Cervical Extension  WNL    Cervical - Right Side Bend  50% limited    Cervical - Left Side Bend  50% limited    Cervical - Right Rotation  WNL    Cervical - Left Rotation  WNL      Strength   Overall Strength  Within functional limits for tasks performed        LYMPHEDEMA/ONCOLOGY QUESTIONNAIRE - 02/09/18 1607      Type   Cancer Type  Left breast cancer      Lymphedema Assessments   Lymphedema Assessments  Upper extremities      Right Upper Extremity Lymphedema   10 cm Proximal to Olecranon Process  25.3 cm    Olecranon Process  23.3 cm    10 cm Proximal to Ulnar Styloid  Process  20.7 cm    Just Proximal to Ulnar Styloid Process  15.6 cm    Across Hand at [employer]  17.6 cm    At [place] of 2nd Digit  6 cm      Left Upper Extremity Lymphedema   10 cm Proximal to Olecranon Process  24.4 cm    Olecranon Process  22.2 cm    10 cm Proximal to Ulnar Styloid Process  20.4 cm    Just Proximal to Ulnar Styloid Process  14.3 cm    Across Hand at [employer]  17.9 cm    At [place] of 2nd Digit  6.1 cm             Objective measurements completed on examination: See above findings.      Patient was instructed today in a home exercise program today for post op shoulder range of motion. These included active assist shoulder flexion in sitting, scapular retraction, wall walking with shoulder abduction, and hands behind head external rotation.  She was encouraged to do these twice a day, holding 3 seconds and repeating 5 times when permitted by her physician.       PT Education - 02/09/18 1608    Education provided  Yes    Education Details  Lymphedema risk reduction and post op shoulder ROM HEP    Person(s) Educated  Patient    Methods  Demonstration;Explanation;Handout    Comprehension  Returned demonstration;Verbalized understanding          PT Long Term Goals - 02/09/18 1612      PT LONG TERM GOAL #1   Title  Patient will demonstrate she has returned to baseline post operatively related to shoulder ROM and function.    Time  [facility] Goals - 02/09/18 1612      Patient will be able to verbalize understanding of pertinent lymphedema risk reduction practices relevant to her diagnosis specifically related to skin care.   Time  1  Period  Days    Status  Achieved      Patient will be able to return demonstrate and/or verbalize understanding of the post-op home exercise program related to regaining shoulder range of motion.   Time  1    Period  Days    Status  Achieved      Patient will  be able to verbalize understanding of the importance of attending the postoperative After Breast Cancer Class for further lymphedema risk reduction education and therapeutic exercise.   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 It measures 1.2 cm and is located in the upper outer quadrant. It is ER/PR positive and HER2 negative with a Ki67 of 15%. She has no other medical problems. Her multidisciplinary medical team met prior to her assessments to determine a recommended treatment plan. She is planning to have a left lumpectomy and sentinel node biopsy followed by possible radiation and anti-estrogen therapy. She will benefit from a post op PT visit to reassess and determine needs.    History and Personal Factors relevant to plan of care:  None    Clinical Presentation  Stable    Clinical Decision Making  Low    Rehab Potential  Excellent    Clinical Impairments Affecting Rehab Potential  None    PT Frequency  -- Eval and 1 f/u visit    PT Treatment/Interventions  ADLs/Self Care Home Management;Therapeutic exercise;Patient/family education    PT Next Visit Plan  Will reassess 3-4 weeks post op to determine PT needs    PT Home Exercise Plan  post op shoulder ROM HEP    Consulted and Agree with Plan of Care  Patient       Patient will benefit from skilled therapeutic intervention in order to improve the following deficits and impairments:  Decreased knowledge of precautions, Impaired UE functional use, Postural dysfunction, Decreased range of motion, Pain  Visit Diagnosis: Malignant neoplasm of upper-inner quadrant of left breast in female, estrogen receptor positive ([place]) - Plan: PT plan of care cert/re-cert  Abnormal posture - Plan: PT plan of care cert/re-cert   Patient will follow up at outpatient cancer rehab 3-4 weeks  following surgery.  If the patient requires physical therapy at that time, a specific plan will be dictated and sent to the referring physician for approval. The patient was educated today on appropriate basic range of motion exercises to begin post operatively and the importance of attending the After Breast Cancer class following surgery.  Patient was educated today on lymphedema risk reduction practices as it pertains to recommendations that will benefit the patient immediately following surgery.  She verbalized good understanding.     Problem List Patient Active Problem List   Diagnosis Date Noted  . Malignant neoplasm of upper-inner quadrant of left breast in female, estrogen receptor positive ([place]) 02/04/2018  . IBS 12/29/2007  . COLONIC POLYPS, HYPERPLASTIC 05/23/2007   Annia Friendly, PT 02/09/18 4:17 PM  [place], [place], [address] Phone: [phone]   Fax:  [phone]  Name: Victoria Hanna MRN: [id number] Date of Birth: 03-27-1941

## 2018-02-09 NOTE — Progress Notes (Addendum)
Radiation Oncology         [phone] ________________________________  Initial outpatient Consultation  Name: Victoria Hanna MRN: [id number]  Date: 02/09/2018  DOB: [birth year]  [id number], Elta Guadeloupe, MD  Crist Infante, MD   REFERRING PHYSICIAN: Crist Infante, MD  DIAGNOSIS:    ICD-10-CM   1. Malignant neoplasm of upper-inner quadrant of left breast in female, estrogen receptor positive ([place]) C50.212    Z17.0   Cancer Staging Malignant neoplasm of upper-inner quadrant of left breast in female, estrogen receptor positive ([place]) Staging form: Breast, AJCC 8th Edition - Clinical stage from 01/31/2018: Stage IA (cT1c, cN0, cM0, G1, ER+, PR+, HER2-) - Signed by Truitt Merle, MD on 02/09/2018  CHIEF COMPLAINT: Here to discuss management of left breast cancer  HISTORY OF PRESENT ILLNESS::Victoria Hanna is a 77 y.o. female who presented for a routine screening mammogram on 01/18/2018 with results showing: Further evaluation is suggested for possible mass in the left breast. She underwent bilateral diagnostic mammography with tomography and left breast ultrasonography at [facility] on 01/25/2018 showing: Suspicious spiculated mass in the 11:30 region of the left breast..  Accordingly on 01/31/2018 she proceeded to biopsy of the left needle core biopsy with pathology from this procedure showing: Breast, left, needle core biopsy, 11:30 o'clock with invasive ductal carcinoma. Prognostic indicators significant for: ER, 100% positive and PR, 70% positive, both with strong staining intensity. Proliferation marker Ki67 at 15%. HER2 negative.  She feels well.  Works in Personal assistant.  Exercises daily. Non smoker.  Mother is 72.  Father died of MI in his 64s.   Denies personal hx of DM, heart disease, or nutritional issues.  She has had skin lesions removed in dermatology in the past.  PREVIOUS RADIATION THERAPY: No  PAST MEDICAL HISTORY:  has a past medical history of Hypertension.    PAST SURGICAL  HISTORY: Past Surgical History:  Procedure Laterality Date  . FOOT TENDON SURGERY Left 2000    FAMILY HISTORY: family history includes Breast cancer (age of onset: 56) in her daughter.  SOCIAL HISTORY:  reports that she has never smoked. She has never used smokeless tobacco. She reports that she drinks about 3.0 oz of alcohol per week.  ALLERGIES: Patient has no known allergies.  MEDICATIONS:  Current Outpatient Medications  Medication Sig Dispense Refill  . telmisartan (MICARDIS) 20 MG tablet Take 10 mg by mouth daily.     No current facility-administered medications for this encounter.     REVIEW OF SYSTEMS: A 10+ POINT REVIEW OF SYSTEMS WAS OBTAINED including neurology, dermatology, psychiatry, cardiac, respiratory, lymph, extremities, GI, GU, Musculoskeletal, constitutional, breasts, reproductive, HEENT.  All pertinent positives are noted in the HPI.     PHYSICAL EXAM:  Vitals with BMI 02/09/2018  Height _0   Weight 115 lbs 10 oz  BMI 00.37  Systolic 048  Diastolic 75  Pulse 71  Respirations 18   General: Alert and oriented, in no acute distress HEENT: Head is normocephalic. Extraocular movements are intact. Oropharynx is clear. Neck: Neck is supple, no palpable cervical or supraclavicular lymphadenopathy. Heart: Regular in rate and rhythm with no murmurs, rubs, or gallops. Chest: Clear to auscultation bilaterally, with no rhonchi, wheezes, or rales. Abdomen: Soft, nontender, nondistended, with no rigidity or guarding. Extremities: No cyanosis or edema. Lymphatics: see Neck Exam Skin: No concerning lesions. She has a horizontal scar on the skin of the UIQ right breast, where she reports she had a previous skin lesion excised.  Musculoskeletal: symmetric  strength and muscle tone throughout. Neurologic: Cranial nerves II through XII are grossly intact. No obvious focalities. Speech is fluent. Coordination is intact. Psychiatric: Judgment and insight are intact. Affect is  appropriate. Breasts: 1 cm mass palpable in the UIQ of the left breast. Otherwise no palpable masses in the breasts or axillary region bilaterally.     ECOG = 0  0 - Asymptomatic (Fully active, able to carry on all predisease activities without restriction)  1 - Symptomatic but completely ambulatory (Restricted in physically strenuous activity but ambulatory and able to carry out work of a light or sedentary nature. For example, light housework, office work)  2 - Symptomatic, <50% in bed during the day (Ambulatory and capable of all self care but unable to carry out any work activities. Up and about more than 50% of waking hours)  3 - Symptomatic, >50% in bed, but not bedbound (Capable of only limited self-care, confined to bed or chair 50% or more of waking hours)  4 - Bedbound (Completely disabled. Cannot carry on any self-care. Totally confined to bed or chair)  5 - Death   Eustace Pen MM, Creech RH, Tormey DC, et al. [phone]). "Toxicity and response criteria of the [facility]". [place] Oncol. 5 (6): 649-55   LABORATORY DATA:  Lab Results  Component Value Date   WBC 7.9 02/09/2018   HGB 14.4 02/09/2018   HCT 46.0 02/09/2018   MCV 95.4 02/09/2018   PLT 354 02/09/2018   CMP     Component Value Date/Time   NA 140 02/09/2018 1237   K 4.6 02/09/2018 1237   CL 103 02/09/2018 1237   CO2 28 02/09/2018 1237   GLUCOSE 112 02/09/2018 1237   BUN 15 02/09/2018 1237   CREATININE 0.84 02/09/2018 1237   CALCIUM 9.7 02/09/2018 1237   PROT 7.3 02/09/2018 1237   ALBUMIN 4.0 02/09/2018 1237   AST 25 02/09/2018 1237   ALT 20 02/09/2018 1237   ALKPHOS 87 02/09/2018 1237   BILITOT 0.5 02/09/2018 1237   GFRNONAA >60 02/09/2018 1237   GFRAA >60 02/09/2018 1237         RADIOGRAPHY: US Breast Ltd Uni Left Inc Axilla  Result Date: 01/25/2018 CLINICAL DATA:  Patient was called back from screening mammogram for a possible mass in the left breast. EXAM: DIGITAL  DIAGNOSTIC LEFT MAMMOGRAM WITH TOMO ULTRASOUND LEFT BREAST COMPARISON:  Previous exam(s). ACR Breast Density Category c: The breast tissue is heterogeneously dense, which may obscure small masses. FINDINGS: Additional imaging of the left breast was performed. There is a 1.4 cm spiculated mass in the 11-12 o'clock region of the breast. On physical exam, I palpate focal thickening in the left breast 11:30 5 cm from the nipple. Targeted ultrasound is performed, showing an irregular hypoechoic mass in the left breast at 11:30 5 cm from the nipple measuring 1.2 x 1.1 x 1.1 cm. Sonographic evaluation the left axilla does not show any enlarged adenopathy. IMPRESSION: Suspicious spiculated mass in the 11:30 region of the left breast. RECOMMENDATION: Ultrasound-guided core biopsy of the mass in the 11:30 region of the left breast is recommended. The ultrasound-guided core biopsy will be scheduled at the patient's convenience. I have discussed the findings and recommendations with the patient. Results were also provided in writing at the conclusion of the visit. If applicable, a reminder letter will be sent to the patient regarding the next appointment. BI-RADS CATEGORY  5: Highly suggestive of malignancy. Electronically Signed   By: Mordecai Rasmussen  Arceo M.D.   On: 01/25/2018 10:33   Mm Diag Breast Tomo Uni Left  Result Date: 01/25/2018 CLINICAL DATA:  Patient was called back from screening mammogram for a possible mass in the left breast. EXAM: DIGITAL DIAGNOSTIC LEFT MAMMOGRAM WITH TOMO ULTRASOUND LEFT BREAST COMPARISON:  Previous exam(s). ACR Breast Density Category c: The breast tissue is heterogeneously dense, which may obscure small masses. FINDINGS: Additional imaging of the left breast was performed. There is a 1.4 cm spiculated mass in the 11-12 o'clock region of the breast. On physical exam, I palpate focal thickening in the left breast 11:30 5 cm from the nipple. Targeted ultrasound is performed, showing an irregular  hypoechoic mass in the left breast at 11:30 5 cm from the nipple measuring 1.2 x 1.1 x 1.1 cm. Sonographic evaluation the left axilla does not show any enlarged adenopathy. IMPRESSION: Suspicious spiculated mass in the 11:30 region of the left breast. RECOMMENDATION: Ultrasound-guided core biopsy of the mass in the 11:30 region of the left breast is recommended. The ultrasound-guided core biopsy will be scheduled at the patient's convenience. I have discussed the findings and recommendations with the patient. Results were also provided in writing at the conclusion of the visit. If applicable, a reminder letter will be sent to the patient regarding the next appointment. BI-RADS CATEGORY  5: Highly suggestive of malignancy. Electronically Signed   By: Lillia Mountain M.D.   On: 01/25/2018 10:33   Mm 3d Screen Breast Bilateral  Result Date: 01/18/2018 CLINICAL DATA:  Screening. EXAM: DIGITAL SCREENING BILATERAL MAMMOGRAM WITH TOMO AND CAD COMPARISON:  Previous exam(s). ACR Breast Density Category c: The breast tissue is heterogeneously dense, which may obscure small masses. FINDINGS: In the left breast, a possible mass warrants further evaluation. In the right breast, no findings suspicious for malignancy. Images were processed with CAD. IMPRESSION: Further evaluation is suggested for possible mass in the left breast. RECOMMENDATION: Diagnostic mammogram and possibly ultrasound of the left breast. (Code:[id number]) The patient will be contacted regarding the findings, and additional imaging will be scheduled. BI-RADS CATEGORY  0: Incomplete. Need additional imaging evaluation and/or prior mammograms for comparison. Electronically Signed   By: Lajean Manes M.D.   On: 01/18/2018 11:55   Mm Clip Placement Left  Result Date: 01/31/2018 CLINICAL DATA:  Status post ultrasound-guided core biopsy of a left breast mass. EXAM: DIAGNOSTIC LEFT MAMMOGRAM POST ULTRASOUND BIOPSY COMPARISON:  Previous exam(s). FINDINGS:  Mammographic images were obtained following ultrasound guided biopsy of a mass in the 11:30 region of the left breast. Mammographic images show there is a ribbon shaped clip in the upper-inner quadrant of the left breast in appropriate position. IMPRESSION: Status post ultrasound-guided core biopsy of a left breast mass with pathology pending. Final Assessment: Post Procedure Mammograms for Marker Placement Electronically Signed   By: Lillia Mountain M.D.   On: 01/31/2018 14:50   [place] Lt Breast Bx W Loc Dev 1st Lesion Img Bx Spec US Guide  Addendum Date: 02/02/2018   ADDENDUM REPORT: 02/01/2018 14:53 ADDENDUM: Pathology revealed GRADE I INVASIVE DUCTAL CARCINOMA of LEFT breast, 11:30 o'clock. This was found to be concordant by Dr. Lillia Mountain. Pathology results were discussed with the patient by telephone. The patient reported doing well after the biopsy with tenderness at the site. Post biopsy instructions and care were reviewed and questions were answered. The patient was encouraged to call [facility] for any additional concerns. The patient was referred to [facility]  at [facility] on Feb 09, 2018. Pathology results reported by Roselind Messier, RN on 02/01/2018. Electronically Signed   By: Lillia Mountain M.D.   On: 02/01/2018 14:53   Result Date: 02/02/2018 CLINICAL DATA:  Left breast mass. EXAM: ULTRASOUND GUIDED LEFT BREAST CORE NEEDLE BIOPSY COMPARISON:  Previous exam(s). FINDINGS: I met with the patient and we discussed the procedure of ultrasound-guided biopsy, including benefits and alternatives. We discussed the high likelihood of a successful procedure. We discussed the risks of the procedure, including infection, bleeding, tissue injury, clip migration, and inadequate sampling. Informed written consent was given. The usual time-out protocol was performed immediately prior to the procedure. Lesion quadrant: Upper inner  quadrant. Using sterile technique and 1% Lidocaine as local anesthetic, under direct ultrasound visualization, a 12 gauge spring-loaded device was used to perform biopsy of a mass in the 11:30 region of the left breast using a lateral to medial approach. At the conclusion of the procedure a ribbon shaped tissue marker clip was deployed into the biopsy cavity. Follow up 2 view mammogram was performed and dictated separately. IMPRESSION: Ultrasound guided biopsy of the left breast. No apparent complications. Electronically Signed: By: Lillia Mountain M.D. On: 01/31/2018 14:33      IMPRESSION/PLAN: Left Breast Cancer   We discussed the randomized data by Randall An al. which randomized women with early stage elderly breast cancer to anti-estrogen therapy versus anti-estrogen therapy and radiotherapy. All women received their adjuvant therapy after lumpectomy. We discussed the modest improvement in local control for women who had radiation therapy as a part of their treatment, but there was no difference in overall survival. She would like to meet with me again after her surgery to consider her options and review the final pathology. I will let our Breast Navigators know.      __________________________________________   Eppie Gibson, MD   This document serves as a record of services personally performed by Eppie Gibson, MD. It was created on her behalf by [facility], a trained medical scribe. The creation of this record is based on the scribe's personal observations and the provider's statements to them. This document has been checked and approved by the attending provider.

## 2018-02-10 ENCOUNTER — Encounter: Payer: Self-pay | Admitting: General Practice

## 2018-02-10 ENCOUNTER — Telehealth: Payer: Self-pay | Admitting: Hematology

## 2018-02-10 NOTE — Telephone Encounter (Signed)
No 5/29 LOS °

## 2018-02-10 NOTE — Progress Notes (Signed)
Victoria Hanna was seen in [facility] to introduce Victoria Hanna team/resources, reviewing distress screen per protocol.  The patient scored a 0 on the Psychosocial Distress Thermometer which indicates minimal distress. Also assessed for distress and other psychosocial needs.   ONCBCN DISTRESS SCREENING 02/10/2018  Screening Type Initial Screening  Distress experienced in past week (1-10) 0  Referral to support programs Yes   Victoria Hanna reports no worry about her situation. She has received full packet of [facility] team/programming resources.   Follow up needed: No. Per pt, no other needs at this time, but please always page if needs arise or circumstances change. Thank you.   [place], [place], [facility] Pager [phone] Voicemail [phone]

## 2018-02-12 HISTORY — PX: BREAST LUMPECTOMY: SHX2

## 2018-02-14 ENCOUNTER — Other Ambulatory Visit: Payer: Self-pay | Admitting: General Surgery

## 2018-02-14 DIAGNOSIS — Z170 Estrogen receptor positive status [ER+]: Principal | ICD-10-CM

## 2018-02-14 DIAGNOSIS — C50212 Malignant neoplasm of upper-inner quadrant of left female breast: Secondary | ICD-10-CM

## 2018-02-15 ENCOUNTER — Encounter (HOSPITAL_BASED_OUTPATIENT_CLINIC_OR_DEPARTMENT_OTHER): Payer: Self-pay | Admitting: *Deleted

## 2018-02-15 ENCOUNTER — Other Ambulatory Visit: Payer: Self-pay

## 2018-02-17 ENCOUNTER — Telehealth: Payer: Self-pay | Admitting: *Deleted

## 2018-02-17 NOTE — Telephone Encounter (Signed)
Spoke with pt concerning [place] from 5.29.19 Denies questions or concerns regarding dx or treatment care plan. Encourage pt to call with needs Received verbal understanding.

## 2018-02-18 ENCOUNTER — Encounter (HOSPITAL_BASED_OUTPATIENT_CLINIC_OR_DEPARTMENT_OTHER)
Admission: RE | Admit: 2018-02-18 | Discharge: 2018-02-18 | Disposition: A | Discharge status: Home / Self Care | Payer: Medicare Other | Source: Ambulatory Visit | Attending: General Surgery | Admitting: General Surgery

## 2018-02-18 DIAGNOSIS — I10 Essential (primary) hypertension: Secondary | ICD-10-CM | POA: Insufficient documentation

## 2018-02-18 NOTE — Progress Notes (Signed)
Ensure pre surgery drink given with instructions to complete by 1145 am dos, surgical scrub soap given with instructions, pt verbalized understanding.

## 2018-02-21 ENCOUNTER — Ambulatory Visit
Admission: RE | Admit: 2018-02-21 | Discharge: 2018-02-21 | Disposition: A | Discharge status: Home / Self Care | Payer: Medicare Other | Source: Ambulatory Visit | Attending: General Surgery | Admitting: General Surgery

## 2018-02-21 DIAGNOSIS — C50212 Malignant neoplasm of upper-inner quadrant of left female breast: Secondary | ICD-10-CM

## 2018-02-21 DIAGNOSIS — Z170 Estrogen receptor positive status [ER+]: Principal | ICD-10-CM

## 2018-02-22 NOTE — H&P (Signed)
Victoria Hanna Appointment: 02/09/2018 1:00 PM Location: [facility] Patient #: [id number] DOB: [birth year] Undefined / Language: Victoria Hanna / Race: White Female   History of Present Illness Victoria Klein MD; 02/09/2018 3:51 PM) The patient is a 77 year old female who presents with breast cancer. Pt is a 77 yo F who presented wtih a screening detected mass in the left breast. She then underwent diagnostic imaging that showed a 1.2 cm mass at 11:30. 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 Ki 67 15%. She has not had prior cancer or breast surgery. She has a daughter who had breast cancer in the last 2-3 years. She was treated at [facility] in [place]. The daughter was 6 or 57 at diagnosis. She is a Forensic psychologist. 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   dx mammo/us 01/25/2018 ACR Breast Density Category c: The breast tissue is heterogeneously dense, which may obscure small masses.  FINDINGS: Additional imaging of the left breast was performed. There is a 1.4 cm spiculated mass in the 11-12 o'clock region of the breast.  On physical exam, I palpate focal thickening in the left breast 11:30 5 cm from the nipple.  Targeted ultrasound is performed, showing an irregular hypoechoic mass in the left breast at 11:30 5 cm from the nipple measuring 1.2 x 1.1 x 1.1 cm. Sonographic evaluation the left axilla does not show any enlarged adenopathy.  IMPRESSION: Suspicious spiculated mass in the 11:30 region of the left breast.  RECOMMENDATION: Ultrasound-guided core biopsy of the mass in the 11:30 region of the left breast is recommended. The ultrasound-guided core biopsy will be scheduled at the patient's convenience.  I have discussed the findings and recommendations with the patient. Results were also provided in writing at the conclusion of the visit. If applicable, a reminder letter will be sent to the  patient regarding the next appointment.  BI-RADS CATEGORY 5: Highly suggestive of malignancy.  pathology 01/31/2018 Breast, left, needle core biopsy, 11:30 o'clock - INVASIVE DUCTAL CARICNOMA. - SEE COMMENT. Microscopic Comment The carcinoma appears grade I. Estrogen Receptor: 100%, POSITIVE, STRONG STAINING INTENSITY Progesterone Receptor: 70%, POSITIVE, STRONG STAINING INTENSITY Proliferation Marker Ki67: 15% HER2 - NEGATIVE  CBC, CMET essentially normal   Diagnostic Studies History Victoria Klein, MD; 02/08/2018 5:37 PM) Colonoscopy  1-5 years ago Mammogram  within last year Pap Smear  1-5 years ago  Social History Victoria Klein, MD; 02/08/2018 5:37 PM) Alcohol use  Moderate alcohol use. Caffeine use  Coffee. No drug use  Tobacco use  Never smoker.  Family History Victoria Klein, MD; 02/08/2018 5:37 PM) Arthritis  Mother. Breast Cancer  Daughter. Heart Disease  Father. Hypertension  Mother. Migraine Headache  Mother.  Other Problems Victoria Klein, MD; 02/08/2018 5:37 PM) Bladder Problems  High blood pressure     Review of Systems Victoria Klein MD; 02/08/2018 5:37 PM) General Not Present- Appetite Loss, Chills, Fatigue, Fever, Night Sweats, Weight Gain and Weight Loss. Skin Not Present- Change in Wart/Mole, Dryness, Hives, Jaundice, New Lesions, Non-Healing Wounds, Rash and Ulcer. Respiratory Not Present- Bloody sputum, Chronic Cough, Difficulty Breathing, Snoring and Wheezing. Breast Not Present- Breast Mass, Breast Pain, Nipple Discharge and Skin Changes. Cardiovascular Present- Leg Cramps. Not Present- Chest Pain, Difficulty Breathing Lying Down, Palpitations, Rapid Heart Rate, Shortness of Breath and Swelling of Extremities. Gastrointestinal Not Present- Abdominal Pain, Bloating, Bloody Stool, Change in Bowel Habits, Chronic diarrhea, Constipation, Difficulty Swallowing,  Excessive gas, Gets full quickly at meals, Hemorrhoids, Indigestion, Nausea, Rectal  Pain and Vomiting. Musculoskeletal Not Present- Back Pain, Joint Pain, Joint Stiffness, Muscle Pain, Muscle Weakness and Swelling of Extremities. Neurological Not Present- Decreased Memory, Fainting, Headaches, Numbness, Seizures, Tingling, Tremor, Trouble walking and Weakness. Psychiatric Not Present- Anxiety, Bipolar, Change in Sleep Pattern, Depression, Fearful and Frequent crying. Endocrine Not Present- Cold Intolerance, Excessive Hunger, Hair Changes, Heat Intolerance, Hot flashes and New Diabetes. Hematology Not Present- Blood Thinners, Easy Bruising, Excessive bleeding, Gland problems, HIV and Persistent Infections.  Vitals Victoria Klein MD; 02/09/2018 3:52 PM) 02/09/2018 3:51 PM Weight: 115.6 lb Height: 63in Body Surface Area: 1.53 m Body Mass Index: 20.48 kg/m  Temp.: 98.53F  Pulse: 71 (Regular)  Resp.: 18 (Unlabored)  BP: 150/75 (Sitting, Left Arm, Standard)       Physical Exam Victoria Klein MD; 02/09/2018 3:55 PM) General Mental Status-Alert. General Appearance-Consistent with stated age. Hydration-Well hydrated. Voice-Normal.  Head and Neck Head-normocephalic, atraumatic with no lesions or palpable masses. Trachea-midline. Thyroid Gland Characteristics - normal size and consistency.  Eye Eyeball - Bilateral-Extraocular movements intact. Sclera/Conjunctiva - Bilateral-No scleral icterus.  Chest and Lung Exam Chest and lung exam reveals -quiet, even and easy respiratory effort with no use of accessory muscles and on auscultation, normal breath sounds, no adventitious sounds and normal vocal resonance. Inspection Chest Wall - Normal. Back - normal.  Breast Note: breasts are reasonably symmetric. left breast has some thickening at 12 o'clock. this is not well defined. no nipple retraction or skin dimpling is present. No LAD. no findings on right. breasts are relatively dense.   Cardiovascular Cardiovascular examination reveals  -normal heart sounds, regular rate and rhythm with no murmurs and normal pedal pulses bilaterally.  Abdomen Inspection Inspection of the abdomen reveals - No Hernias. Palpation/Percussion Palpation and Percussion of the abdomen reveal - Soft, Non Tender, No Rebound tenderness, No Rigidity (guarding) and No hepatosplenomegaly. Auscultation Auscultation of the abdomen reveals - Bowel sounds normal.  Neurologic Neurologic evaluation reveals -alert and oriented x 3 with no impairment of recent or remote memory. Mental Status-Normal.  Musculoskeletal Global Assessment -Note: no gross deformities.  Normal Exam - Left-Upper Extremity Strength Normal and Lower Extremity Strength Normal. Normal Exam - Right-Upper Extremity Strength Normal and Lower Extremity Strength Normal.  Lymphatic Head & Neck  General Head & Neck Lymphatics: Bilateral - Description - Normal. Axillary  General Axillary Region: Bilateral - Description - Normal. Tenderness - Non Tender. Femoral & Inguinal  Generalized Femoral & Inguinal Lymphatics: Bilateral - Description - No Generalized lymphadenopathy.    Assessment & Plan Victoria Klein MD; 02/09/2018 3:57 PM) PRIMARY CANCER OF UPPER INNER QUADRANT OF LEFT FEMALE BREAST (C50.212) Impression: Pt has a new diagnosis of a cT1cN0 left breast cancer, upper inner quadrant, hormone receptor positive. She is a good candidate for a seed localized lumpectomy wtih sentinel lymph node biopsy. This would be followed by external radaition, possible oncotype, and antiestrogen treatment.  i discussed surgery with the patient.  The surgical procedure was described to the patient. I discussed the incision type and location and that we would need radiology involved on with a wire or seed marker and/or sentinel node.  The risks and benefits of the procedure were described to the patient and she wishes to proceed.  We discussed the risks bleeding, infection, damage to  other structures, need for further procedures/surgeries. We discussed the risk of seroma. The patient was advised if the area in the breast in cancer, we may  need to go back to surgery for additional tissue to obtain negative margins or for a lymph node biopsy. The patient was advised that these are the most common complications, but that others can occur as well. They were advised against taking aspirin or other anti-inflammatory agents/blood thinners the week before surgery. Current Plans You are being scheduled for surgery- Our schedulers will call you.  You should hear from our office's scheduling department within 5 working days about the location, date, and time of surgery. We try to make accommodations for patient's preferences in scheduling surgery, but sometimes the OR schedule or the surgeon's schedule prevents [place] from making those accommodations.  If you have not heard from our office [phone]) in 5 working days, call the office and ask for your surgeon's nurse.  If you have other questions about your diagnosis, plan, or surgery, call the office and ask for your surgeon's nurse.  Pt Education - [facility] Breast Cancer Information Given - [employer] "Breast Journey" Package Pt Education - [facility]b breast cancer surgery: discussed with patient and provided information.   Signed by Victoria Klein, MD (02/09/2018 3:58 PM)

## 2018-02-24 ENCOUNTER — Encounter (HOSPITAL_COMMUNITY)
Admission: RE | Admit: 2018-02-24 | Discharge: 2018-02-24 | Disposition: A | Discharge status: Home / Self Care | Payer: Medicare Other | Source: Ambulatory Visit | Attending: General Surgery | Admitting: General Surgery

## 2018-02-24 ENCOUNTER — Ambulatory Visit (HOSPITAL_BASED_OUTPATIENT_CLINIC_OR_DEPARTMENT_OTHER): Payer: Medicare Other | Admitting: Anesthesiology

## 2018-02-24 ENCOUNTER — Ambulatory Visit
Admission: RE | Admit: 2018-02-24 | Discharge: 2018-02-24 | Disposition: A | Discharge status: Home / Self Care | Payer: Medicare Other | Source: Ambulatory Visit | Attending: General Surgery | Admitting: General Surgery

## 2018-02-24 ENCOUNTER — Encounter (HOSPITAL_BASED_OUTPATIENT_CLINIC_OR_DEPARTMENT_OTHER): Payer: Self-pay | Admitting: *Deleted

## 2018-02-24 ENCOUNTER — Ambulatory Visit (HOSPITAL_BASED_OUTPATIENT_CLINIC_OR_DEPARTMENT_OTHER)
Admission: RE | Admit: 2018-02-24 | Discharge: 2018-02-24 | Disposition: A | Discharge status: Home / Self Care | Payer: Medicare Other | Source: Ambulatory Visit | Attending: General Surgery | Admitting: General Surgery

## 2018-02-24 ENCOUNTER — Encounter (HOSPITAL_BASED_OUTPATIENT_CLINIC_OR_DEPARTMENT_OTHER)
Admission: RE | Disposition: A | Discharge status: Home / Self Care | Payer: Self-pay | Source: Ambulatory Visit | Attending: General Surgery

## 2018-02-24 ENCOUNTER — Other Ambulatory Visit: Payer: Self-pay

## 2018-02-24 DIAGNOSIS — I10 Essential (primary) hypertension: Secondary | ICD-10-CM | POA: Diagnosis not present

## 2018-02-24 DIAGNOSIS — C50212 Malignant neoplasm of upper-inner quadrant of left female breast: Secondary | ICD-10-CM | POA: Insufficient documentation

## 2018-02-24 DIAGNOSIS — Z79899 Other long term (current) drug therapy: Secondary | ICD-10-CM | POA: Diagnosis not present

## 2018-02-24 DIAGNOSIS — D0512 Intraductal carcinoma in situ of left breast: Secondary | ICD-10-CM | POA: Diagnosis not present

## 2018-02-24 DIAGNOSIS — Z170 Estrogen receptor positive status [ER+]: Principal | ICD-10-CM

## 2018-02-24 HISTORY — PX: BREAST LUMPECTOMY WITH RADIOACTIVE SEED AND SENTINEL LYMPH NODE BIOPSY: SHX6550

## 2018-02-24 SURGERY — BREAST LUMPECTOMY WITH RADIOACTIVE SEED AND SENTINEL LYMPH NODE BIOPSY
Anesthesia: General | Site: Breast | Laterality: Left

## 2018-02-24 MED ORDER — PROMETHAZINE HCL 25 MG/ML IJ SOLN: 6.2500 mg | INTRAMUSCULAR | Status: DC | PRN

## 2018-02-24 MED ORDER — PROPOFOL 10 MG/ML IV BOLUS
INTRAVENOUS | Status: DC | PRN
  Administered 2018-02-24: 150 mg via INTRAVENOUS

## 2018-02-24 MED ORDER — GABAPENTIN 100 MG PO CAPS
200.0000 mg | ORAL_CAPSULE | ORAL | Status: AC
  Administered 2018-02-24: 200 mg via ORAL

## 2018-02-24 MED ORDER — 0.9 % SODIUM CHLORIDE (POUR BTL) OPTIME
TOPICAL | Status: DC | PRN
  Administered 2018-02-24: 200 mL

## 2018-02-24 MED ORDER — ONDANSETRON HCL 4 MG/2ML IJ SOLN
INTRAMUSCULAR | Status: AC
  Filled 2018-02-24: qty 2

## 2018-02-24 MED ORDER — EPHEDRINE SULFATE 50 MG/ML IJ SOLN
INTRAMUSCULAR | Status: DC | PRN
  Administered 2018-02-24: 15 mg via INTRAVENOUS

## 2018-02-24 MED ORDER — GABAPENTIN 100 MG PO CAPS
ORAL_CAPSULE | ORAL | Status: AC
  Filled 2018-02-24: qty 2

## 2018-02-24 MED ORDER — TECHNETIUM TC 99M SULFUR COLLOID FILTERED
1.0000 | Freq: Once | INTRAVENOUS | Status: AC | PRN
  Administered 2018-02-24: 1 via INTRADERMAL

## 2018-02-24 MED ORDER — LIDOCAINE-EPINEPHRINE (PF) 1 %-1:200000 IJ SOLN
INTRAMUSCULAR | Status: DC | PRN
  Administered 2018-02-24: 50 mL via SUBCUTANEOUS

## 2018-02-24 MED ORDER — BUPIVACAINE-EPINEPHRINE (PF) 0.25% -1:200000 IJ SOLN
INTRAMUSCULAR | Status: DC | PRN
  Administered 2018-02-24: 30 mL

## 2018-02-24 MED ORDER — METHYLENE BLUE 0.5 % INJ SOLN
INTRAVENOUS | Status: AC
  Filled 2018-02-24: qty 10

## 2018-02-24 MED ORDER — MIDAZOLAM HCL 2 MG/2ML IJ SOLN
INTRAMUSCULAR | Status: AC
  Filled 2018-02-24: qty 2

## 2018-02-24 MED ORDER — CHLORHEXIDINE GLUCONATE CLOTH 2 % EX PADS: 6.0000 | MEDICATED_PAD | Freq: Once | CUTANEOUS | Status: DC

## 2018-02-24 MED ORDER — LIDOCAINE 2% (20 MG/ML) 5 ML SYRINGE
INTRAMUSCULAR | Status: DC | PRN
  Administered 2018-02-24: 20 mg via INTRAVENOUS

## 2018-02-24 MED ORDER — ONDANSETRON HCL 4 MG/2ML IJ SOLN
INTRAMUSCULAR | Status: DC | PRN
  Administered 2018-02-24: 4 mg via INTRAVENOUS

## 2018-02-24 MED ORDER — EPHEDRINE SULFATE 50 MG/ML IJ SOLN
INTRAMUSCULAR | Status: AC
  Filled 2018-02-24: qty 1

## 2018-02-24 MED ORDER — DEXAMETHASONE SODIUM PHOSPHATE 4 MG/ML IJ SOLN
INTRAMUSCULAR | Status: DC | PRN
  Administered 2018-02-24: 10 mg via INTRAVENOUS

## 2018-02-24 MED ORDER — FENTANYL CITRATE (PF) 100 MCG/2ML IJ SOLN: 25.0000 ug | INTRAMUSCULAR | Status: DC | PRN

## 2018-02-24 MED ORDER — SODIUM CHLORIDE 0.9 % IJ SOLN
INTRAMUSCULAR | Status: AC
  Filled 2018-02-24: qty 10

## 2018-02-24 MED ORDER — SCOPOLAMINE 1 MG/3DAYS TD PT72: 1.0000 | MEDICATED_PATCH | Freq: Once | TRANSDERMAL | Status: DC | PRN

## 2018-02-24 MED ORDER — ACETAMINOPHEN 500 MG PO TABS
ORAL_TABLET | ORAL | Status: AC
  Filled 2018-02-24: qty 2

## 2018-02-24 MED ORDER — OXYCODONE HCL 5 MG PO TABS: 5.0000 mg | ORAL_TABLET | Freq: Four times a day (QID) | ORAL | 0 refills | Status: DC | PRN

## 2018-02-24 MED ORDER — CEFAZOLIN SODIUM-DEXTROSE 2-4 GM/100ML-% IV SOLN
2.0000 g | INTRAVENOUS | Status: AC
  Administered 2018-02-24: 2 g via INTRAVENOUS

## 2018-02-24 MED ORDER — LIDOCAINE HCL (CARDIAC) PF 100 MG/5ML IV SOSY
PREFILLED_SYRINGE | INTRAVENOUS | Status: AC
  Filled 2018-02-24: qty 5

## 2018-02-24 MED ORDER — ACETAMINOPHEN 500 MG PO TABS
1000.0000 mg | ORAL_TABLET | ORAL | Status: AC
  Administered 2018-02-24: 1000 mg via ORAL

## 2018-02-24 MED ORDER — MIDAZOLAM HCL 2 MG/2ML IJ SOLN
1.0000 mg | INTRAMUSCULAR | Status: DC | PRN
  Administered 2018-02-24: 1 mg via INTRAVENOUS

## 2018-02-24 MED ORDER — DEXAMETHASONE SODIUM PHOSPHATE 10 MG/ML IJ SOLN
INTRAMUSCULAR | Status: AC
  Filled 2018-02-24: qty 1

## 2018-02-24 MED ORDER — FENTANYL CITRATE (PF) 100 MCG/2ML IJ SOLN
INTRAMUSCULAR | Status: AC
  Filled 2018-02-24: qty 2

## 2018-02-24 MED ORDER — FENTANYL CITRATE (PF) 100 MCG/2ML IJ SOLN
50.0000 ug | INTRAMUSCULAR | Status: DC | PRN
  Administered 2018-02-24: 100 ug via INTRAVENOUS

## 2018-02-24 MED ORDER — CEFAZOLIN SODIUM-DEXTROSE 2-4 GM/100ML-% IV SOLN
INTRAVENOUS | Status: AC
  Filled 2018-02-24: qty 100

## 2018-02-24 MED ORDER — LACTATED RINGERS IV SOLN
INTRAVENOUS | Status: DC
  Administered 2018-02-24: 13:00:00 via INTRAVENOUS

## 2018-02-24 MED ORDER — SCOPOLAMINE 1 MG/3DAYS TD PT72: 1.0000 | MEDICATED_PATCH | TRANSDERMAL | Status: DC

## 2018-02-24 SURGICAL SUPPLY — 70 items
ADH SKN CLS APL DERMABOND .7 (GAUZE/BANDAGES/DRESSINGS) ×1
BINDER BREAST LRG (GAUZE/BANDAGES/DRESSINGS) IMPLANT
BINDER BREAST MEDIUM (GAUZE/BANDAGES/DRESSINGS) ×2 IMPLANT
BINDER BREAST XLRG (GAUZE/BANDAGES/DRESSINGS) IMPLANT
BINDER BREAST XXLRG (GAUZE/BANDAGES/DRESSINGS) IMPLANT
BLADE SURG 10 STRL SS (BLADE) ×3 IMPLANT
BLADE SURG 15 STRL LF DISP TIS (BLADE) ×1 IMPLANT
BLADE SURG 15 STRL SS (BLADE) ×3
BNDG COHESIVE 4X5 TAN STRL (GAUZE/BANDAGES/DRESSINGS) ×3 IMPLANT
CANISTER SUC SOCK COL 7IN (MISCELLANEOUS) IMPLANT
CANISTER SUCT 1200ML W/VALVE (MISCELLANEOUS) ×3 IMPLANT
CHLORAPREP W/TINT 26ML (MISCELLANEOUS) ×3 IMPLANT
CLIP VESOCCLUDE LG 6/CT (CLIP) ×3 IMPLANT
CLIP VESOCCLUDE MED 6/CT (CLIP) ×6 IMPLANT
CLIP VESOCCLUDE SM WIDE 6/CT (CLIP) IMPLANT
CLOSURE WOUND 1/2 X4 (GAUZE/BANDAGES/DRESSINGS) ×1
COVER MAYO STAND STRL (DRAPES) ×3 IMPLANT
COVER PROBE W GEL 5X96 (DRAPES) ×3 IMPLANT
DECANTER SPIKE VIAL GLASS SM (MISCELLANEOUS) IMPLANT
DERMABOND ADVANCED (GAUZE/BANDAGES/DRESSINGS) ×2
DERMABOND ADVANCED .7 DNX12 (GAUZE/BANDAGES/DRESSINGS) ×1 IMPLANT
DEVICE DUBIN W/COMP PLATE 8390 (MISCELLANEOUS) ×3 IMPLANT
DRAPE UTILITY XL STRL (DRAPES) ×3 IMPLANT
DRSG PAD ABDOMINAL 8X10 ST (GAUZE/BANDAGES/DRESSINGS) ×3 IMPLANT
ELECT COATED BLADE 2.86 ST (ELECTRODE) ×3 IMPLANT
ELECT REM PT RETURN 9FT ADLT (ELECTROSURGICAL) ×3
ELECTRODE REM PT RTRN 9FT ADLT (ELECTROSURGICAL) ×1 IMPLANT
GAUZE SPONGE 4X4 12PLY STRL LF (GAUZE/BANDAGES/DRESSINGS) ×3 IMPLANT
GLOVE BIO SURGEON STRL SZ 6 (GLOVE) ×3 IMPLANT
GLOVE BIO SURGEON STRL SZ 6.5 (GLOVE) ×1 IMPLANT
GLOVE BIO SURGEONS STRL SZ 6.5 (GLOVE) ×1
GLOVE BIOGEL PI IND STRL 6.5 (GLOVE) ×1 IMPLANT
GLOVE BIOGEL PI IND STRL 7.0 (GLOVE) IMPLANT
GLOVE BIOGEL PI INDICATOR 6.5 (GLOVE) ×4
GLOVE BIOGEL PI INDICATOR 7.0 (GLOVE) ×2
GOWN STRL REUS W/ TWL LRG LVL3 (GOWN DISPOSABLE) ×1 IMPLANT
GOWN STRL REUS W/ TWL XL LVL3 (GOWN DISPOSABLE) IMPLANT
GOWN STRL REUS W/TWL 2XL LVL3 (GOWN DISPOSABLE) ×3 IMPLANT
GOWN STRL REUS W/TWL LRG LVL3 (GOWN DISPOSABLE)
GOWN STRL REUS W/TWL XL LVL3 (GOWN DISPOSABLE) ×3
KIT MARKER MARGIN INK (KITS) ×3 IMPLANT
LIGHT WAVEGUIDE WIDE FLAT (MISCELLANEOUS) ×2 IMPLANT
NDL HYPO 25X1 1.5 SAFETY (NEEDLE) ×1 IMPLANT
NDL SAFETY ECLIPSE 18X1.5 (NEEDLE) IMPLANT
NEEDLE HYPO 18GX1.5 SHARP (NEEDLE)
NEEDLE HYPO 25X1 1.5 SAFETY (NEEDLE) ×3 IMPLANT
NS IRRIG 1000ML POUR BTL (IV SOLUTION) ×3 IMPLANT
PACK BASIN DAY SURGERY FS (CUSTOM PROCEDURE TRAY) ×3 IMPLANT
PACK UNIVERSAL I (CUSTOM PROCEDURE TRAY) ×3 IMPLANT
PENCIL BUTTON HOLSTER BLD 10FT (ELECTRODE) ×3 IMPLANT
SLEEVE SCD COMPRESS KNEE MED (MISCELLANEOUS) ×3 IMPLANT
SPONGE LAP 18X18 RF (DISPOSABLE) ×6 IMPLANT
STAPLER VISISTAT 35W (STAPLE) IMPLANT
STOCKINETTE IMPERVIOUS LG (DRAPES) ×3 IMPLANT
STRIP CLOSURE SKIN 1/2X4 (GAUZE/BANDAGES/DRESSINGS) ×2 IMPLANT
SUT ETHILON 2 0 FS 18 (SUTURE) IMPLANT
SUT MNCRL AB 4-0 PS2 18 (SUTURE) ×3 IMPLANT
SUT MON AB 5-0 PS2 18 (SUTURE) IMPLANT
SUT SILK 2 0 SH (SUTURE) IMPLANT
SUT VIC AB 2-0 SH 27 (SUTURE) ×3
SUT VIC AB 2-0 SH 27XBRD (SUTURE) ×1 IMPLANT
SUT VIC AB 3-0 SH 27 (SUTURE) ×3
SUT VIC AB 3-0 SH 27X BRD (SUTURE) ×1 IMPLANT
SUT VICRYL 3-0 CR8 SH (SUTURE) ×3 IMPLANT
SYR CONTROL 10ML LL (SYRINGE) ×3 IMPLANT
TOWEL GREEN STERILE FF (TOWEL DISPOSABLE) ×3 IMPLANT
TOWEL OR NON WOVEN STRL DISP B (DISPOSABLE) IMPLANT
TUBE CONNECTING 20'X1/4 (TUBING) ×1
TUBE CONNECTING 20X1/4 (TUBING) ×2 IMPLANT
YANKAUER SUCT BULB TIP NO VENT (SUCTIONS) ×3 IMPLANT

## 2018-02-24 NOTE — Anesthesia Preprocedure Evaluation (Signed)
Anesthesia Evaluation  Patient identified by MRN, date of birth, ID band Patient awake    Reviewed: Allergy & Precautions, NPO status , Patient's Chart, lab work & pertinent test results  Airway Mallampati: II  TM Distance: >3 FB Neck ROM: Full    Dental no notable dental hx. (+) Dental Advisory Given   Pulmonary neg pulmonary ROS,    Pulmonary exam normal        Cardiovascular hypertension, Pt. on medications negative cardio ROS Normal cardiovascular exam     Neuro/Psych negative neurological ROS  negative psych ROS   GI/Hepatic negative GI ROS, Neg liver ROS,   Endo/Other  negative endocrine ROS  Renal/GU negative Renal ROS  negative genitourinary   Musculoskeletal negative musculoskeletal ROS (+)   Abdominal   Peds negative pediatric ROS (+)  Hematology negative hematology ROS (+)   Anesthesia Other Findings   Reproductive/Obstetrics negative OB ROS                             Anesthesia Physical Anesthesia Plan  ASA: II  Anesthesia Plan: General   Post-op Pain Management:  Regional for Post-op pain   Induction: Intravenous  PONV Risk Score and Plan: 3 and Ondansetron, Dexamethasone and Scopolamine patch - Pre-op  Airway Management Planned: LMA  Additional Equipment:   Intra-op Plan:   Post-operative Plan: Extubation in OR  Informed Consent: I have reviewed the patients History and Physical, chart, labs and discussed the procedure including the risks, benefits and alternatives for the proposed anesthesia with the patient or authorized representative who has indicated his/her understanding and acceptance.   Dental advisory given  Plan Discussed with: CRNA and Anesthesiologist  Anesthesia Plan Comments:         Anesthesia Quick Evaluation

## 2018-02-24 NOTE — Anesthesia Postprocedure Evaluation (Signed)
Anesthesia Post Note  Patient: Victoria Hanna  Procedure(s) Performed: LEFT BREAST LUMPECTOMY WITH RADIOACTIVE SEED AND SENTINEL LYMPH NODE BIOPSY (Left Breast)     Patient location during evaluation: PACU Anesthesia Type: General Level of consciousness: sedated Pain management: pain level controlled Vital Signs Assessment: post-procedure vital signs reviewed and stable Respiratory status: spontaneous breathing and respiratory function stable Cardiovascular status: stable Postop Assessment: no apparent nausea or vomiting Anesthetic complications: no    Last Vitals:  Vitals:   02/24/18 1700 02/24/18 1715  BP: (!) 149/68 (!) 144/78  Pulse: 72 74  Resp: 17 19  Temp:    SpO2: 98% 100%    Last Pain:  Vitals:   02/24/18 1700  TempSrc:   PainSc: 0-No pain                 Christina Gintz DANIEL

## 2018-02-24 NOTE — Op Note (Signed)
Left Breast Radioactive seed localized lumpectomy and sentinel lymph node biopsy  Indications: This patient presents with history of left breast cancer, upper inner quadrant, cT1cN0, grade 1, +/+/-  Pre-operative Diagnosis: left breast cancer  Post-operative Diagnosis: Same  Surgeon: [name]   Anesthesia: General endotracheal anesthesia  ASA Class: 2  Procedure Details  The patient was seen in the Holding Room. The risks, benefits, complications, treatment options, and expected outcomes were discussed with the patient. The possibilities of bleeding, infection, the need for additional procedures, failure to diagnose a condition, and creating a complication requiring transfusion or operation were discussed with the patient. The patient concurred with the proposed plan, giving informed consent.  The site of surgery properly noted/marked. The patient was taken to Operating Room # 8, identified, and the procedure verified as left Breast seed localized Lumpectomy with sentinel lymph node biopsy. A Time Out was held and the above information confirmed.  The left arm, breast, and chest were prepped and draped in standard fashion. The lumpectomy was performed by creating a superior circumareolar incision below the previously placed radioactive seed.  Dissection was carried down to around the point of maximum signal intensity. The cautery was used to perform the dissection.  Hemostasis was achieved with cautery. The edges of the cavity were marked with large clips, with one each medial, lateral, inferior and superior, and two clips posteriorly.   The specimen was inked with the margin marker paint kit.    Specimen radiography confirmed inclusion of the mammographic lesion, the clip, and the seed.  The background signal in the breast was zero.  The wound was irrigated and closed with 3-0 vicryl in layers and 4-0 monocryl subcuticular suture.    Using a hand-held gamma probe, left axillary sentinel nodes  were identified transcutaneously.  An oblique incision was created below the axillary hairline.  Dissection was carried through the clavipectoral fascia.  Six deep level 2 axillary sentinel nodes were removed.  Counts per second were 350 at the highest, 25 at the lowest.    The background count was 15 cps.  The wound was irrigated.  Hemostasis was achieved with cautery.  The axillary incision was closed with a 3-0 vicryl deep dermal interrupted sutures and a 4-0 monocryl subcuticular closure.    Sterile dressings were applied. At the end of the operation, all sponge, instrument, and needle counts were correct.  Findings: grossly clear surgical margins and no adenopathy, anterior margin is skin, posterior margin is pectoralis  Estimated Blood Loss:  min         Specimens: left breast lumpectomy and six deep left axillary sentinel lymph nodes.             Complications:  None; patient tolerated the procedure well.         Disposition: PACU - hemodynamically stable.         Condition: stable

## 2018-02-24 NOTE — Progress Notes (Addendum)
Assisted Dr. Tobias Alexander with left, ultrasound guided, pectoralis block also nuclear med tech with nuc med injection Side rails up, monitors on throughout procedure. See vital signs in flow sheet. Tolerated Procedure well.

## 2018-02-24 NOTE — Anesthesia Procedure Notes (Signed)
Anesthesia Regional Block: Pectoralis block   Pre-Anesthetic Checklist: ,, timeout performed, Correct Patient, Correct Site, Correct Laterality, Correct Procedure, Correct Position, site marked, Risks and benefits discussed,  Surgical consent,  Pre-op evaluation,  At surgeon's request and post-op pain management  Laterality: Left  Prep: chloraprep       Needles:  Injection technique: Single-shot  Needle Type: Echogenic Stimulator Needle     Needle Length: 10cm  Needle Gauge: 21     Additional Needles:   Narrative:  Start time: 02/24/2018 2:24 PM End time: 02/24/2018 2:34 PM Injection made incrementally with aspirations every 5 mL.  Performed by: Personally

## 2018-02-24 NOTE — Discharge Instructions (Signed)
°Post Anesthesia Home Care Instructions ° °Activity: °Get plenty of rest for the remainder of the day. A responsible individual must stay with you for 24 hours following the procedure.  °For the next 24 hours, DO NOT: °-Drive a car °-Operate machinery °-Drink alcoholic beverages °-Take any medication unless instructed by your physician °-Make any legal decisions or sign important papers. ° °Meals: °Start with liquid foods such as gelatin or soup. Progress to regular foods as tolerated. Avoid greasy, spicy, heavy foods. If nausea and/or vomiting occur, drink only clear liquids until the nausea and/or vomiting subsides. Call your physician if vomiting continues. ° °Special Instructions/Symptoms: °Your throat may feel dry or sore from the anesthesia or the breathing tube placed in your throat during surgery. If this causes discomfort, gargle with warm salt water. The discomfort should disappear within 24 hours. ° °If you had a scopolamine patch placed behind your ear for the management of post- operative nausea and/or vomiting: ° °1. The medication in the patch is effective for 72 hours, after which it should be removed.  Wrap patch in a tissue and discard in the trash. Wash hands thoroughly with soap and water. °2. You may remove the patch earlier than 72 hours if you experience unpleasant side effects which may include dry mouth, dizziness or visual disturbances. °3. Avoid touching the patch. Wash your hands with soap and water after contact with the patch. °   °[facility] °Office Phone Number [phone] ° °BREAST BIOPSY/ PARTIAL MASTECTOMY: POST OP INSTRUCTIONS ° °Always review your discharge instruction sheet given to you by the facility where your surgery was performed. ° °IF YOU HAVE DISABILITY OR FAMILY LEAVE FORMS, YOU MUST BRING THEM TO THE OFFICE FOR PROCESSING.  DO NOT GIVE THEM TO YOUR DOCTOR. ° °1. A prescription for pain medication may be given to you upon discharge.  Take your pain  medication as prescribed, if needed.  If narcotic pain medicine is not needed, then you may take acetaminophen (Tylenol) or ibuprofen (Advil) as needed. °2. Take your usually prescribed medications unless otherwise directed °3. If you need a refill on your pain medication, please contact your pharmacy.  They will contact our office to request authorization.  Prescriptions will not be filled after 5pm or on week-ends. °4. You should eat very light the first 24 hours after surgery, such as soup, crackers, pudding, etc.  Resume your normal diet the day after surgery. °5. Most patients will experience some swelling and bruising in the breast.  Ice packs and a good support bra will help.  Swelling and bruising can take several days to resolve.  °6. It is common to experience some constipation if taking pain medication after surgery.  Increasing fluid intake and taking a stool softener will usually help or prevent this problem from occurring.  A mild laxative (Milk of Magnesia or Miralax) should be taken according to package directions if there are no bowel movements after 48 hours. °7. Unless discharge instructions indicate otherwise, you may remove your bandages 48 hours after surgery, and you may shower at that time.  You may have steri-strips (small skin tapes) in place directly over the incision.  These strips should be left on the skin for 7-10 days.   Any sutures or staples will be removed at the office during your follow-up visit. °8. ACTIVITIES:  You may resume regular daily activities (gradually increasing) beginning the next day.  Wearing a good support bra or sports bra (or the breast binder) minimizes   pain and swelling.  You may have sexual intercourse when it is comfortable. °a. You may drive when you no longer are taking prescription pain medication, you can comfortably wear a seatbelt, and you can safely maneuver your car and apply brakes. °b. RETURN TO WORK:  __________1 week_______________ °9. You should  see your doctor in the office for a follow-up appointment approximately two weeks after your surgery.  Your doctor’s nurse will typically make your follow-up appointment when she calls you with your pathology report.  Expect your pathology report 2-3 business days after your surgery.  You may call to check if you do not hear from us after three days. ° ° °WHEN TO CALL YOUR DOCTOR: °1. Fever over 101.0 °2. Nausea and/or vomiting. °3. Extreme swelling or bruising. °4. Continued bleeding from incision. °5. Increased pain, redness, or drainage from the incision. ° °The clinic staff is available to answer your questions during regular business hours.  Please don’t hesitate to call and ask to speak to one of the nurses for clinical concerns.  If you have a medical emergency, go to the nearest emergency room or call 911.  A surgeon from [facility] is always on call at the hospital. ° °For further questions, please visit [email or web address]  ° °

## 2018-02-24 NOTE — Anesthesia Procedure Notes (Signed)
Procedure Name: LMA Insertion Performed by: Tinika Bucknam W, CRNA Pre-anesthesia Checklist: Patient identified, Emergency Drugs available, Suction available and Patient being monitored Patient Re-evaluated:Patient Re-evaluated prior to induction Oxygen Delivery Method: Circle system utilized Preoxygenation: Pre-oxygenation with 100% oxygen Induction Type: IV induction Ventilation: Mask ventilation without difficulty LMA: LMA inserted LMA Size: 4.0 Number of attempts: 1 Placement Confirmation: positive ETCO2 Tube secured with: Tape Dental Injury: Teeth and Oropharynx as per pre-operative assessment        

## 2018-02-24 NOTE — Interval H&P Note (Signed)
History and Physical Interval Note:  02/24/2018 2:17 PM  Victoria Hanna  has presented today for surgery, with the diagnosis of LEFT BREAST CANCER  The various methods of treatment have been discussed with the patient and family. After consideration of risks, benefits and other options for treatment, the patient has consented to  Procedure(s): BREAST LUMPECTOMY WITH RADIOACTIVE SEED AND SENTINEL LYMPH NODE BIOPSY (Left) as a surgical intervention .  The patient's history has been reviewed, patient examined, no change in status, stable for surgery.  I have reviewed the patient's chart and labs.  Questions were answered to the patient's satisfaction.     [name]

## 2018-02-24 NOTE — Transfer of Care (Signed)
Immediate Anesthesia Transfer of Care Note  Patient: Victoria Hanna  Procedure(s) Performed: LEFT BREAST LUMPECTOMY WITH RADIOACTIVE SEED AND SENTINEL LYMPH NODE BIOPSY (Left Breast)  Patient Location: PACU  Anesthesia Type:General and Regional  Level of Consciousness: awake and sedated  Airway & Oxygen Therapy: Patient Spontanous Breathing and Patient connected to face mask oxygen  Post-op Assessment: Report given to RN and Post -op Vital signs reviewed and stable  Post vital signs: Reviewed and stable  Last Vitals:  Vitals Value Taken Time  BP    Temp    Pulse 73 02/24/2018  4:35 PM  Resp 13 02/24/2018  4:35 PM  SpO2 100 % 02/24/2018  4:35 PM  Vitals shown include unvalidated device data.  Last Pain:  Vitals:   02/24/18 1309  TempSrc: Oral  PainSc: 0-No pain         Complications: No apparent anesthesia complications

## 2018-02-25 ENCOUNTER — Encounter (HOSPITAL_BASED_OUTPATIENT_CLINIC_OR_DEPARTMENT_OTHER): Payer: Self-pay | Admitting: General Surgery

## 2018-02-28 ENCOUNTER — Telehealth: Payer: Self-pay | Admitting: *Deleted

## 2018-02-28 NOTE — Progress Notes (Signed)
Please let the patient know margins and lymph nodes are negative.

## 2018-02-28 NOTE — Telephone Encounter (Signed)
Received order for oncotype testing. Requisition faxed to pharmacy received by [facility].

## 2018-03-18 ENCOUNTER — Telehealth: Payer: Self-pay | Admitting: *Deleted

## 2018-03-18 NOTE — Telephone Encounter (Signed)
Received oncotype score of 18/5%. Physician team notified. Called pt and discussed results and that she does not need chemo. Informed next steps are to discuss xrt with Dr. Isidore Moos. Pt relate she would like to see Dr. Barry Dienes post-op prior to scheduling further appts.

## 2018-03-22 ENCOUNTER — Encounter (HOSPITAL_COMMUNITY): Payer: Self-pay | Admitting: Hematology

## 2018-03-22 ENCOUNTER — Telehealth: Payer: Self-pay | Admitting: Hematology

## 2018-03-22 NOTE — Telephone Encounter (Signed)
Appointment scheduled patient notified per 7/8 sch msg

## 2018-03-29 NOTE — Progress Notes (Signed)
[facility]  [phone] [phone]  Clinic Follow-up Note   Patient Care Team: Crist Infante, MD as PCP - General (Internal Medicine) Eppie Gibson, MD as Attending Physician (Radiation Oncology) Truitt Merle, MD as Consulting Physician (Hematology)   Date of Service:   04/04/2018  CHIEF COMPLAINTS:  Follow up on Malignant neoplasm of upper-inner quadrant of left breast   Oncology History   Cancer Staging Malignant neoplasm of upper-inner quadrant of left breast in female, estrogen receptor positive ([place]) Staging form: Breast, AJCC 8th Edition - Clinical stage from 01/31/2018: Stage IA (cT1c, cN0, cM0, G1, ER+, PR+, HER2-) - Signed by Truitt Merle, MD on 02/09/2018 - Pathologic stage from 02/24/2018: Stage IA (pT1c, pN0, cM0, G1, ER+, PR+, HER2-, Oncotype DX score: 18) - Signed by Truitt Merle, MD on 04/04/2018       Malignant neoplasm of upper-inner quadrant of left breast in female, estrogen receptor positive ([place])   01/25/2018 Mammogram    Diagnostic Mammogram 01/25/18  IMPRESSION:  Suspicious irregular hypoechoic mass in the left breast at 11:30 5 cm from the nipple measuring 1.2 x 1.1 x 1.1 cm.    RECOMMENDATION: Ultrasound-guided core biopsy of the mass in the 11:30 region of the left breast is recommended. The ultrasound-guided core biopsy will be scheduled at the patient's convenience       01/31/2018 Initial Biopsy     Diagnosis 01/31/18  Breast, left, needle core biopsy, 11:30 o'clock - INVASIVE DUCTAL CARICNOMA. - SEE COMMENT. Microscopic Comment The carcinoma appears grade I. A breast prognostic profile will be performed and the results reported separately. The results were called to [facility] on 02/01/18. (JBK:gt, 02/01/18)       01/31/2018 Receptors her2    HER2 Negative ER 100% positive PR 70% positive  Ki67 15%      01/31/2018 Cancer Staging    Staging form: Breast, AJCC 8th Edition - Clinical stage  from 01/31/2018: Stage IA (cT1c, cN0, cM0, G1, ER+, PR+, HER2-) - Signed by Truitt Merle, MD on 02/09/2018      02/04/2018 Initial Diagnosis    Malignant neoplasm of upper-inner quadrant of left breast in female, estrogen receptor positive ([place])      02/24/2018 Surgery    02/24/2018 Left Breast Lumpectomy and sentinel lymph node biopsy with Dr Barry Dienes       02/24/2018 Pathology Results    02/24/2018 Surgical Pathology Diagnosis 1. Breast, lumpectomy, left - INVASIVE DUCTAL CARCINOMA WITH CALCIFICATIONS, GRADE I/III, SPANNING 1.8 CM. - DUCTAL CARCINOMA IN SITU, LOW GRADE. - THE SURGICAL RESECTION MARGINS ARE NEGATIVE FOR CARCINOMA. - SEE ONCOLOGY TABLE BELOW. 2. Lymph node, sentinel, biopsy, left axillary #1 - THERE IS NO EVIDENCE OF CARCINOMA IN 1 OF 1 LYMPH NODE (0/1). 3. Lymph node, sentinel, biopsy, left axillary #2 - THERE IS NO EVIDENCE OF CARCINOMA IN 1 OF 1 LYMPH NODE (0/1). 4. Lymph node, sentinel, biopsy, left axillary #3 - THERE IS NO EVIDENCE OF CARCINOMA IN 1 OF 1 LYMPH NODE (0/1). 5. Lymph node, sentinel, biopsy, left axillary #4 - THERE IS NO EVIDENCE OF CARCINOMA IN 1 OF 1 LYMPH NODE (0/1). 6. Lymph node, sentinel, biopsy, left axillary #5 - THERE IS NO EVIDENCE OF CARCINOMA IN 1 OF 1 LYMPH NODE (0/1). 7. Lymph node, sentinel, biopsy, left axillary #6 - THERE IS NO EVIDENCE OF CARCINOMA IN 1 OF 1 LYMPH NODE (0/1).       02/24/2018 Oncotype testing    RS 18 Distant Recurrence  risk at 9 years 5% Absolute chemotherapy benefit <1%      02/24/2018 Cancer Staging    Staging form: Breast, AJCC 8th Edition - Pathologic stage from 02/24/2018: Stage IA (pT1c, pN0, cM0, G1, ER+, PR+, HER2-, Oncotype DX score: 18) - Signed by Truitt Merle, MD on 04/04/2018       HISTORY OF PRESENTING ILLNESS: 02/09/18  Victoria Hanna 77 y.o. female is a here because of newly diagnosed left breast cancer. The patient presents to the Breast Clinic today by herself.   This was discovered by  screening mammogram. She did not feel the lump herself. She has lumpy breast tissues and may often get called back for follow up scans, however she did not have a biopsy prior to this. She denies breast change, nipple bleeding or discharge.    Today she notes she was contacted over the phone yesterday in [place] and asked questions regarding her diagnosis and current health. She over all feels well and healthy.  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 She is very active. She has 2 daughters. She notes she is divorced. Her first husband died in his 33s from colon cancer. She drinks wine moderately. She is a non-smoker.   She is overall very healthy. She had a tendon separate in her left foot in the past and had 1 surgery to repair this at [facility] in 2000. Her daughter had breast cancer at age 56 in [place] and was treated and cured. Patient keeps up with her regular screenings. She takes Micardis for HTN. She had menopause in her late 38s with little difficulty and was complete at 66. She took hormonal replacements before she started menopause.    GYN HISTORY  Menarchal: 12/13 LMP: late 67s to age 65yo Contraceptive: NA HRT: 7-8 years, started before menopause G2P2: 2 daughters, breast feeding was short term  CURRENT THERAPY: pending adjuvant anastrozole vs breast cancer surveillance  INTERVAL HISTORY Victoria Hanna is a 77 y.o. female who is here for follow up after left breast lumpectomy. She is here alone. She states that her surgery went well and she is very happy with it. She can move her arm freely without limitations.  She wants to think about starting Anti-estrogen therapy. She doesn't want radiation therapy.    MEDICAL HISTORY:  Past Medical History:  Diagnosis Date  . Hypertension     SURGICAL HISTORY: Past Surgical History:  Procedure Laterality Date  . BREAST LUMPECTOMY WITH RADIOACTIVE SEED AND SENTINEL LYMPH NODE BIOPSY Left 02/24/2018   Procedure: LEFT  BREAST LUMPECTOMY WITH RADIOACTIVE SEED AND SENTINEL LYMPH NODE BIOPSY;  Surgeon: [name], MD;  Location: [facility];  Service: General;  Laterality: Left;  . FOOT TENDON SURGERY Left 2000    SOCIAL HISTORY: Social History   Socioeconomic History  . Marital status: Divorced    Spouse name: Not on file  . Number of children: Not on file  . Years of education: Not on file  . Highest education level: Not on file  Occupational History  . Not on file  Social Needs  . Financial resource strain: Not on file  . Food insecurity:    Worry: Not on file    Inability: Not on file  . Transportation needs:    Medical: Not on file    Non-medical: Not on file  Tobacco Use  . Smoking status: Never Smoker  . Smokeless tobacco: Never Used  Substance and Sexual Activity  . Alcohol  use: Yes    Alcohol/week: 3.0 oz    Types: 5 Glasses of wine per week  . Drug use: Never  . Sexual activity: Not on file  Lifestyle  . Physical activity:    Days per week: Not on file    Minutes per session: Not on file  . Stress: Not on file  Relationships  . Social connections:    Talks on phone: Not on file    Gets together: Not on file    Attends religious service: Not on file    Active member of club or organization: Not on file    Attends meetings of clubs or organizations: Not on file    Relationship status: Not on file  . Intimate partner violence:    Fear of current or ex partner: Not on file    Emotionally abused: Not on file    Physically abused: Not on file    Forced sexual activity: Not on file  Other Topics Concern  . Not on file  Social History Narrative  . Not on file    FAMILY HISTORY: Family History  Problem Relation Age of Onset  . Breast cancer Daughter 102    ALLERGIES:  has No Known Allergies.  MEDICATIONS:  Current Outpatient Medications  Medication Sig Dispense Refill  . cholecalciferol (VITAMIN D) 1000 units tablet Take 1,000 Units by mouth daily.      Marland Kitchen telmisartan (MICARDIS) 20 MG tablet Take 10 mg by mouth daily.    . vitamin C (ASCORBIC ACID) 500 MG tablet Take 500 mg by mouth daily.     No current facility-administered medications for this visit.     REVIEW OF SYSTEMS:   Constitutional: Denies fevers, chills or abnormal night sweats Eyes: Denies blurriness of vision, double vision or watery eyes Ears, nose, mouth, throat, and face: Denies mucositis or sore throat Respiratory: Denies cough, dyspnea or wheezes Cardiovascular: Denies palpitation, chest discomfort or lower extremity swelling Gastrointestinal: Denies nausea, heartburn or change in bowel habits Skin: Denies abnormal skin rashes Lymphatics: Denies new lymphadenopathy or easy bruising Neurological:Denies numbness, tingling or new weaknesses Breast: (+) s/p lumpectomy  Behavioral/Psych: Mood is stable, no new changes  All other systems were reviewed with the patient and are negative.  PHYSICAL EXAMINATION:  ECOG PERFORMANCE STATUS: 0 - Asymptomatic  Vitals:   04/04/18 1337  BP: (!) 147/67  Pulse: 62  Temp: 97.8 F (36.6 C)  SpO2: 100%   Filed Weights   04/04/18 1337  Weight: 116 lb 3.2 oz (52.7 kg)    GENERAL:alert, no distress and comfortable SKIN: skin color, texture, turgor are normal, no rashes or significant lesions EYES: normal, conjunctiva are pink and non-injected, sclera clear OROPHARYNX:no exudate, no erythema and lips, buccal mucosa, and tongue normal  NECK: supple, thyroid normal size, non-tender, without nodularity LYMPH:  no palpable lymphadenopathy in the cervical, axillary or inguinal LUNGS: clear to auscultation and percussion with normal breathing effort HEART: regular rate & rhythm and no murmurs and no lower extremity edema ABDOMEN:abdomen soft, non-tender and normal bowel sounds Musculoskeletal:no cyanosis of digits and no clubbing  PSYCH: alert & oriented x 3 with fluent speech NEURO: no focal motor/sensory deficits BREAST: (+)  s/p left breast lumpectomy. No palpable lump or adenopathy of left or right breast or axilla   LABORATORY DATA:  I have reviewed the data as listed CBC Latest Ref Rng & Units 02/09/2018  WBC 3.9 - 10.3 K/uL 7.9  Hemoglobin 11.6 - 15.9 g/dL 14.4  Hematocrit  34.8 - 46.6 % 46.0  Platelets 145 - 400 K/uL 354    CMP Latest Ref Rng & Units 02/09/2018  Glucose 70 - 140 mg/dL 112  BUN 7 - 26 mg/dL 15  Creatinine 0.60 - 1.10 mg/dL 0.84  Sodium 136 - 145 mmol/L 140  Potassium 3.5 - 5.1 mmol/L 4.6  Chloride 98 - 109 mmol/L 103  CO2 22 - 29 mmol/L 28  Calcium 8.4 - 10.4 mg/dL 9.7  Total Protein 6.4 - 8.3 g/dL 7.3  Total Bilirubin 0.2 - 1.2 mg/dL 0.5  Alkaline Phos 40 - 150 U/L 87  AST 5 - 34 U/L 25  ALT 0 - 55 U/L 20    Surgery 02/24/2018 Left Breast Lumpectomy with Dr Barry Dienes  PATHOLOGY  02/24/2018 Surgical Pathology Diagnosis 1. Breast, lumpectomy, left - INVASIVE DUCTAL CARCINOMA WITH CALCIFICATIONS, GRADE I/III, SPANNING 1.8 CM. - DUCTAL CARCINOMA IN SITU, LOW GRADE. - THE SURGICAL RESECTION MARGINS ARE NEGATIVE FOR CARCINOMA. - SEE ONCOLOGY TABLE BELOW. 2. Lymph node, sentinel, biopsy, left axillary #1 - THERE IS NO EVIDENCE OF CARCINOMA IN 1 OF 1 LYMPH NODE (0/1). 3. Lymph node, sentinel, biopsy, left axillary #2 - THERE IS NO EVIDENCE OF CARCINOMA IN 1 OF 1 LYMPH NODE (0/1). 4. Lymph node, sentinel, biopsy, left axillary #3 - THERE IS NO EVIDENCE OF CARCINOMA IN 1 OF 1 LYMPH NODE (0/1). 5. Lymph node, sentinel, biopsy, left axillary #4 - THERE IS NO EVIDENCE OF CARCINOMA IN 1 OF 1 LYMPH NODE (0/1). 6. Lymph node, sentinel, biopsy, left axillary #5 - THERE IS NO EVIDENCE OF CARCINOMA IN 1 OF 1 LYMPH NODE (0/1). 7. Lymph node, sentinel, biopsy, left axillary #6 - THERE IS NO EVIDENCE OF CARCINOMA IN 1 OF 1 LYMPH NODE (0/1). Microscopic Comment 1. INVASIVE CARCINOMA OF THE BREAST: Resection Procedure: Lumpectomy. Specimen Laterality: Left. Tumor Size: 1.8 cm (gross  measurement). Histologic Type: Ductal. Histologic Grade: I. Glandular (Acinar)/Tubular Differentiation: 2. Nuclear Pleomorphism: 2. Mitotic Rate: 1. Overall Grade: I/III. Ductal Carcinoma In Situ: Present, low grade. Tumor Extension: Confined to breast parenchyma. Margins: Distance from closest margin (millimeters): Greater than 0.2 cm to all margins. DCIS Margins: Distance from closest margin (millimeters): Greater than 0.2 cm to all margins. Regional Lymph Nodes: Number of Lymph Nodes Examined: 6. Number of Sentinel Nodes Examined (if applicable): 6. Number of Lymph Nodes with Macrometastases (>2 mm): 0. Number of Lymph Nodes with Micrometastases: 0. Number of Lymph Nodes with Isolated Tumor Cells (0.2 mm or 200 cells): 0. Size of Largest Metastatic Deposit (millimeters): N/A. Extranodal Extension: N/A. Treatment Effect: No known presurgical therapy. Breast Biomarker Testing Performed on Previous Biopsy: Yes. Testing Performed on Case Number: [id number]. Estrogen Receptor: 100%, strong. Progesterone Receptor: 70%, strong. HER2: No amplification was detected. The ratio was 1.13. Ki-67: 15%. Representative tumor block: 1B. Pathologic Stage Classification (pTNM, AJCC 8th Edition): pT1c, pN0. (v4.2.0.0) Specimen Gross and Clinical Information Specimen(s) Obtained: 1. Breast, lumpectomy, left 2. Lymph node, sentinel, biopsy, left axillary #1 3. Lymph node, sentinel, biopsy, left axillary #2 4. Lymph node, sentinel, biopsy, left axillary #3 5. Lymph node, sentinel, biopsy, left axillary #4 6. Lymph node, sentinel, biopsy, left axillary #5 7. Lymph node, sentinel, biopsy, left axillary #6 Specimen Clinical Information 1. left breast cancer (cm) Gross 1. Specimen type: Received fresh and placed in formalin at 4:35 p.m. on 02/24/18 and labeled left breast mass. Size: 6.6 cm medial to lateral x 5.0 cm superior to inferior x 3.0 cm anterior to posterior. Orientation: The  specimen is received inked as follows: anterior green, inferior blue, lateral orange, medial yellow, posterior black, superior red. Localized area: There are two pins inserted in the anterior inferior aspect at the specimen. A radioactive seed is identified and removed per protocol. Cut surface: Yellow lobulated adipose tissue with tan-white, dense fibrous tissue (approximately 20% fibrous). At the localized area of interest, there is a 1.8 x 1.4 x 1.2 cm tan white, firm, ill defined lesion identified. Within the lesion a silver metallic ribbon shaped biopsy clip is identified. Margins: The margins are inked as previously stated. The lesion measures 0.8 cm to the posterior margin, 0.8 cm to the inferior margin, 0.6 cm to superior margin, and 0.8 cm to the anterior margin. Prognostic indicators: Obtained from paraffin blocks if needed. Block summary: 7 blocks submitted. A= lateral margin, perpendicular B-F= localized area of interest. G= medial margin, perpendicular. 2. Received fresh and consists of a 0.8 x 0.6 x 0.4 cm tan pink possible lymph node. The specimen is entirely submitted in one cassette. 3. Specimen is received fresh and consists of a 1.0 x 0.6 x 0.4 cm tan possible lymph node. The specimen is entirely submitted in one cassette. 4. Received fresh and consists of a 2.0 x 1.5 x 1.0 cm tan pink possible lymph node. The specimen is bisected and entirely submitted in two cassettes. 5. Received fresh and consists of a 0.8 x 0.5 x 0.4 cm tan possible lymph node. The specimen is entirely submitted in one cassette. 6. Received fresh and consists of a 0.7 x 0.5 x 0.2 cm disrupted possible lymph node. The specimen is entirely submitted in one cassette. 7. Received fresh and consists of a 2.3 x 1.0 x 0.8 cm tan pink  02/24/2018 Molecular Pathology   Diagnosis 01/31/18  Breast, left, needle core biopsy, 11:30 o'clock - INVASIVE DUCTAL CARICNOMA. - SEE COMMENT. Microscopic Comment The  carcinoma appears grade I. A breast prognostic profile will be performed and the results reported separately. The results were called to [facility] on 02/01/18. (JBK:gt, 02/01/18) ADDITIONAL INFORMATION: PROGNOSTIC INDICATORS Results: IMMUNOHISTOCHEMICAL AND MORPHOMETRIC ANALYSIS PERFORMED MANUALLY Estrogen Receptor: 100%, POSITIVE, STRONG STAINING INTENSITY Progesterone Receptor: 70%, POSITIVE, STRONG STAINING INTENSITY Proliferation Marker Ki67: 15% REFERENCE RANGE ESTROGEN RECEPTOR NEGATIVE 0% POSITIVE =>1% REFERENCE RANGE PROGESTERONE RECEPTOR NEGATIVE 0% POSITIVE =>1% All controls stained appropriately Claudette Laws MD Pathologist, Electronic Signature ( Signed 02/03/2018) FLUORESCENCE IN-SITU HYBRIDIZATION Results: HER2 - NEGATIVE RATIO OF HER2/CEP17 SIGNALS 1.13 AVERAGE HER2 COPY NUMBER PER CELL 1.80 Reference Range: NEGATIVE HER2/CEP17 Ratio <2.0 and average HER2 copy number <4.0 EQUIVOCAL HER2/CEP17 Ratio <2.0 and average HER2 copy number >=4.0 and <6.0 POSITIVE HER2/CEP17 Ratio >=2.0 or <2.0 and average HER2 copy number >=6.0   RADIOGRAPHIC STUDIES: I have personally reviewed the radiological images as listed and agreed with the findings in the report. No results found.  ASSESSMENT & PLAN:  Victoria Hanna is a 77 y.o. Caucasian female with no significant medical history other than HTN, on Micardis.    1. Malignant neoplasm of upper-inner quadrant of left breast, Stage IA (pT1cN0M0) ER/PR Positive, HER2 Negative, Grade I, RS 18 ---I discussed her surgical path result in details -the Oncotype Dx result was reviewed with her in details. She has low risk based on the recurrence score, which predicts 9 year distant recurrence after 5 years of tamoxifen 5%. Based on the [place] study result, this is consider low risk and no benefit of adjuvant chemotherapy. -Giving the strong ER and PR  expression in her tumor and her premenopausal status, I recommend  adjuvant endocrine therapy with AI for 5 years, given her advanced age and early stage cancer.  --The potential benefit and side effects, which includes but not limited to, hot flash, skin and vaginal dryness, metabolic changes ( increased blood glucose, cholesterol, weight, etc.), slightly in increased risk of cardiovascular disease, cataracts, muscular and joint discomfort, osteopenia and osteoporosis, etc, were discussed with her in great details.  We also discussed the potential benefit and side effect of  alternative antiestrogen therapy tamoxifen. She voiced good understanding. -After lengthy discussion, patient is still very reluctant to take djuvant antiestrogen therapy, due to the concern of side effects. She is 77 yo, and values her quality of her life much more.  I printed out reading material about anastrozole, she will think about more.  -She also declined adjuvant radiation.  I will let Dr. Isidore Moos know. -We discussed the breast cancer surveillance after her surgery. She will continue annual screening mammogram, self exam, and a routine office visit with lab and exam with [place]. -I encouraged her to have healthy diet and exercise regularly -She is recovering well after surgery.  2. Genetic Testing  -Based on her daughter's early breast cancer and patient's new breast cancer diagnosis, she is eligible for genetic testing. I discussed if she is found positive I would encourage her other daughter to be tested. She will think about this.   3. HTN -Continue medication, follow-up with her primary care physician   PLAN:  - She will think about anti-hormonal therapy and has declined radiation.  - f/u and labs in 6 months, sooner if she is willing to take anastrozole.     All questions were answered. The patient knows to call the clinic with any problems, questions or concerns. I spent 25 minutes counseling the patient face to face. The total time spent in the appointment was 30 minutes and  more than 50% was on counseling.    Dierdre Searles Dweik am acting as scribe for Dr. Truitt Merle.  I have reviewed the above documentation for accuracy and completeness, and I agree with the above.     Truitt Merle, MD 04/04/2018 3:36 PM  I have reviewed the above documentation for accuracy and completeness, and I agree with the above.

## 2018-04-04 ENCOUNTER — Telehealth: Payer: Self-pay

## 2018-04-04 ENCOUNTER — Encounter: Payer: Self-pay | Admitting: Hematology

## 2018-04-04 ENCOUNTER — Inpatient Hospital Stay: Payer: Medicare Other | Attending: Hematology | Admitting: Hematology

## 2018-04-04 VITALS — BP 147/67 | HR 62 | Temp 97.8°F | Ht 63.0 in | Wt 116.2 lb

## 2018-04-04 DIAGNOSIS — C50212 Malignant neoplasm of upper-inner quadrant of left female breast: Secondary | ICD-10-CM | POA: Diagnosis not present

## 2018-04-04 DIAGNOSIS — Z170 Estrogen receptor positive status [ER+]: Secondary | ICD-10-CM | POA: Diagnosis not present

## 2018-04-04 DIAGNOSIS — I10 Essential (primary) hypertension: Secondary | ICD-10-CM | POA: Diagnosis not present

## 2018-04-04 DIAGNOSIS — Z803 Family history of malignant neoplasm of breast: Secondary | ICD-10-CM | POA: Diagnosis not present

## 2018-04-04 NOTE — Telephone Encounter (Signed)
Printed avs and calender of upcoming appointment. Per 7/22 los 

## 2018-09-29 ENCOUNTER — Other Ambulatory Visit: Payer: Self-pay

## 2018-09-29 DIAGNOSIS — C50212 Malignant neoplasm of upper-inner quadrant of left female breast: Secondary | ICD-10-CM

## 2018-09-29 DIAGNOSIS — Z170 Estrogen receptor positive status [ER+]: Principal | ICD-10-CM

## 2018-10-03 ENCOUNTER — Other Ambulatory Visit: Payer: Medicare Other

## 2018-10-03 ENCOUNTER — Ambulatory Visit: Payer: Medicare Other | Admitting: Hematology

## 2019-05-05 ENCOUNTER — Other Ambulatory Visit: Payer: Self-pay | Admitting: Internal Medicine

## 2019-05-05 DIAGNOSIS — E785 Hyperlipidemia, unspecified: Secondary | ICD-10-CM

## 2019-05-05 DIAGNOSIS — C50912 Malignant neoplasm of unspecified site of left female breast: Secondary | ICD-10-CM

## 2019-05-31 ENCOUNTER — Other Ambulatory Visit: Payer: Self-pay

## 2019-05-31 ENCOUNTER — Ambulatory Visit
Admission: RE | Admit: 2019-05-31 | Discharge: 2019-05-31 | Disposition: A | Discharge status: Home / Self Care | Payer: Medicare Other | Source: Ambulatory Visit | Attending: Internal Medicine | Admitting: Internal Medicine

## 2019-05-31 DIAGNOSIS — C50912 Malignant neoplasm of unspecified site of left female breast: Secondary | ICD-10-CM

## 2020-07-09 ENCOUNTER — Other Ambulatory Visit: Payer: Self-pay | Admitting: Internal Medicine

## 2020-07-09 DIAGNOSIS — Z1231 Encounter for screening mammogram for malignant neoplasm of breast: Secondary | ICD-10-CM

## 2020-08-16 ENCOUNTER — Ambulatory Visit: Payer: Medicare Other

## 2020-08-19 ENCOUNTER — Ambulatory Visit
Admission: RE | Admit: 2020-08-19 | Discharge: 2020-08-19 | Disposition: A | Discharge status: Home / Self Care | Payer: Medicare Other | Source: Ambulatory Visit | Attending: Internal Medicine | Admitting: Internal Medicine

## 2020-08-19 ENCOUNTER — Other Ambulatory Visit: Payer: Self-pay

## 2020-08-19 ENCOUNTER — Other Ambulatory Visit: Payer: Self-pay | Admitting: Internal Medicine

## 2020-08-19 DIAGNOSIS — Z1231 Encounter for screening mammogram for malignant neoplasm of breast: Secondary | ICD-10-CM

## 2020-08-19 DIAGNOSIS — Z853 Personal history of malignant neoplasm of breast: Secondary | ICD-10-CM

## 2020-12-18 ENCOUNTER — Other Ambulatory Visit: Payer: Self-pay | Admitting: Internal Medicine

## 2020-12-18 DIAGNOSIS — E785 Hyperlipidemia, unspecified: Secondary | ICD-10-CM

## 2021-01-21 LAB — COLOGUARD: COLOGUARD: NEGATIVE

## 2021-02-13 ENCOUNTER — Inpatient Hospital Stay: Admission: RE | Admit: 2021-02-13 | Payer: Medicare Other | Source: Ambulatory Visit

## 2021-07-15 ENCOUNTER — Other Ambulatory Visit: Payer: Self-pay | Admitting: Internal Medicine

## 2021-07-15 DIAGNOSIS — R928 Other abnormal and inconclusive findings on diagnostic imaging of breast: Secondary | ICD-10-CM

## 2021-08-21 ENCOUNTER — Ambulatory Visit
Admission: RE | Admit: 2021-08-21 | Discharge: 2021-08-21 | Disposition: A | Discharge status: Home / Self Care | Payer: Medicare Other | Source: Ambulatory Visit | Attending: Internal Medicine | Admitting: Internal Medicine

## 2021-08-21 ENCOUNTER — Other Ambulatory Visit: Payer: Self-pay | Admitting: Internal Medicine

## 2021-08-21 DIAGNOSIS — R928 Other abnormal and inconclusive findings on diagnostic imaging of breast: Secondary | ICD-10-CM

## 2022-01-09 ENCOUNTER — Other Ambulatory Visit: Payer: Self-pay | Admitting: Internal Medicine

## 2022-01-09 DIAGNOSIS — E785 Hyperlipidemia, unspecified: Secondary | ICD-10-CM

## 2022-02-12 ENCOUNTER — Other Ambulatory Visit: Payer: Medicare Other

## 2022-03-05 ENCOUNTER — Other Ambulatory Visit: Payer: Medicare Other

## 2022-04-20 ENCOUNTER — Ambulatory Visit
Admission: RE | Admit: 2022-04-20 | Discharge: 2022-04-20 | Disposition: A | Discharge status: Home / Self Care | Payer: Medicare Other | Source: Ambulatory Visit | Attending: Internal Medicine | Admitting: Internal Medicine

## 2022-04-20 DIAGNOSIS — E785 Hyperlipidemia, unspecified: Secondary | ICD-10-CM

## 2022-08-11 ENCOUNTER — Other Ambulatory Visit: Payer: Self-pay | Admitting: Internal Medicine

## 2022-08-11 DIAGNOSIS — Z1231 Encounter for screening mammogram for malignant neoplasm of breast: Secondary | ICD-10-CM

## 2022-10-06 ENCOUNTER — Ambulatory Visit
Admission: RE | Admit: 2022-10-06 | Discharge: 2022-10-06 | Disposition: A | Discharge status: Home / Self Care | Payer: Medicare Other | Source: Ambulatory Visit | Attending: Internal Medicine | Admitting: Internal Medicine

## 2022-10-06 DIAGNOSIS — Z1231 Encounter for screening mammogram for malignant neoplasm of breast: Secondary | ICD-10-CM

## 2023-10-05 ENCOUNTER — Other Ambulatory Visit: Payer: Self-pay | Admitting: Internal Medicine

## 2023-10-05 DIAGNOSIS — Z1231 Encounter for screening mammogram for malignant neoplasm of breast: Secondary | ICD-10-CM

## 2023-10-25 ENCOUNTER — Ambulatory Visit: Payer: Medicare Other

## 2023-11-03 ENCOUNTER — Ambulatory Visit: Payer: Medicare Other

## 2023-11-13 IMAGING — MG MM DIGITAL SCREENING BILAT W/ TOMO AND CAD
8 series · 9 of 24 positions shown · non-contrast
Comparison: Previous exam(s).

CLINICAL DATA: Screening.

EXAM:
DIGITAL SCREENING BILATERAL MAMMOGRAM WITH TOMOSYNTHESIS AND CAD
TECHNIQUE: Bilateral screening digital craniocaudal and mediolateral oblique
mammograms were obtained. Bilateral screening digital breast
tomosynthesis was performed. The images were evaluated with
computer-aided detection.

[L MLO synth-2D]
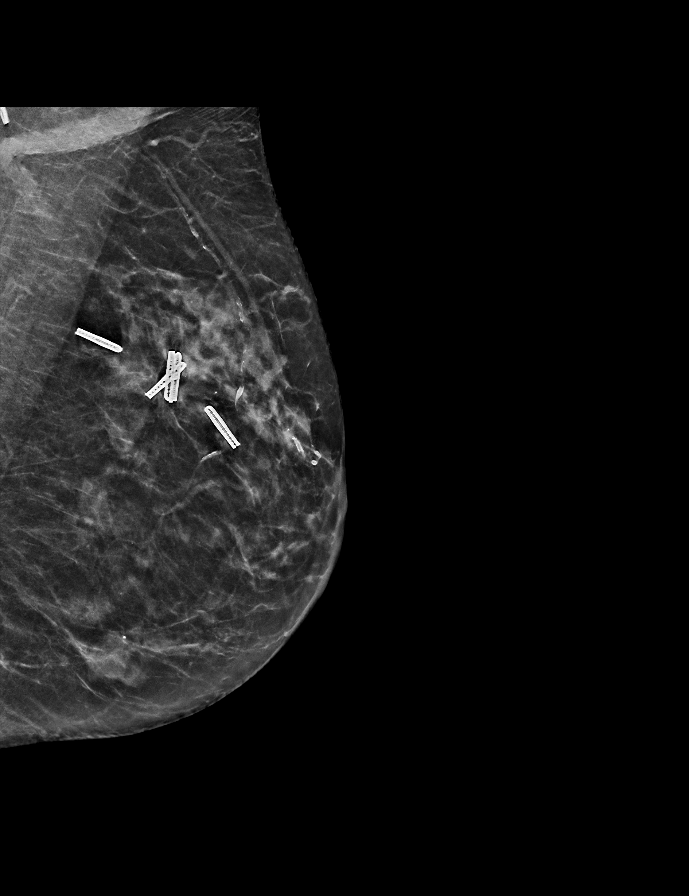

[R MLO synth-2D]
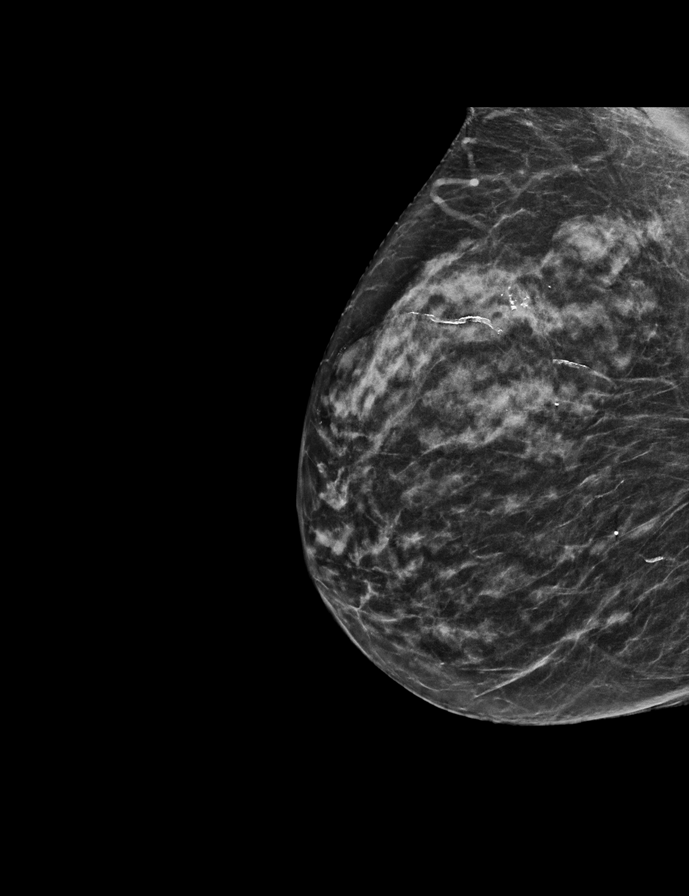

[R CC synth-2D]
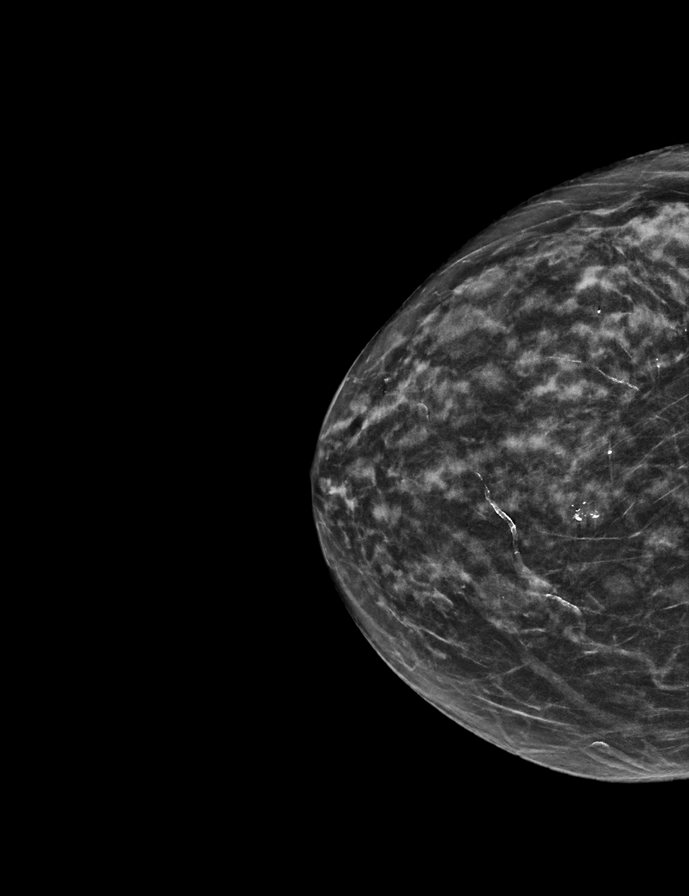

[L CC synth-2D]
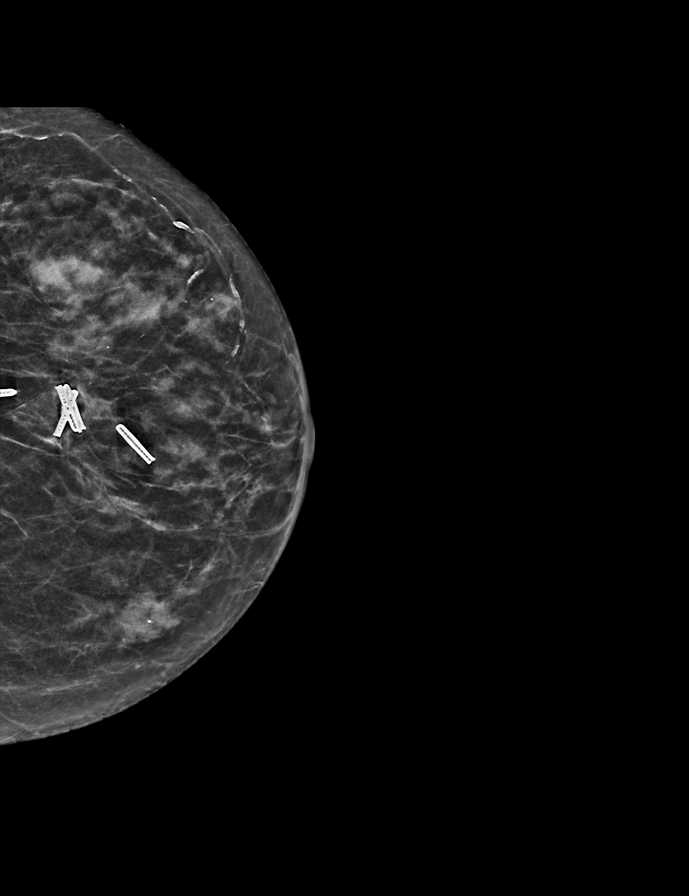

[L CC tomo · 2 of 47 frames shown]
[frame 16/47]
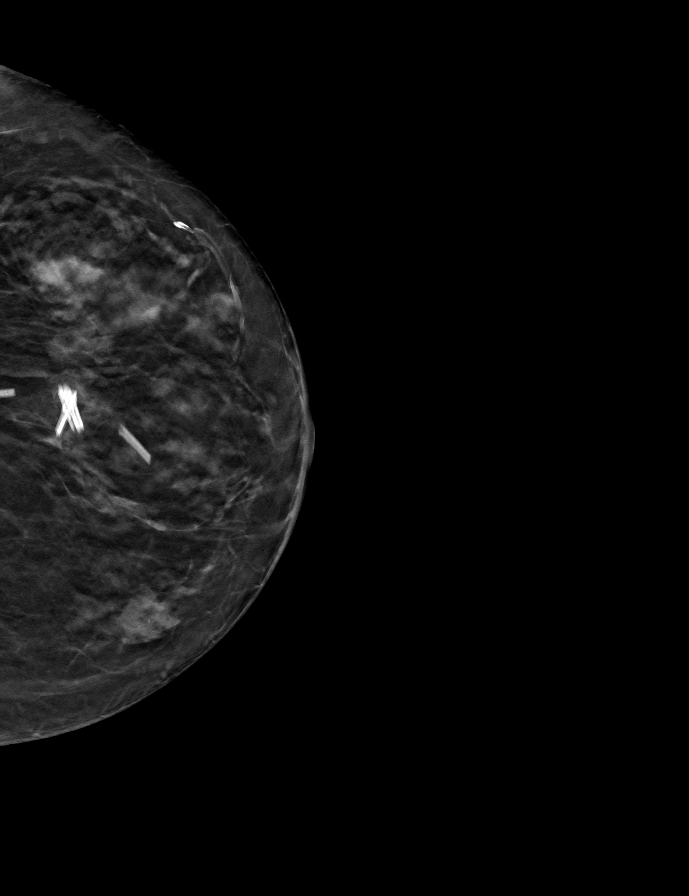
[frame 24/47]
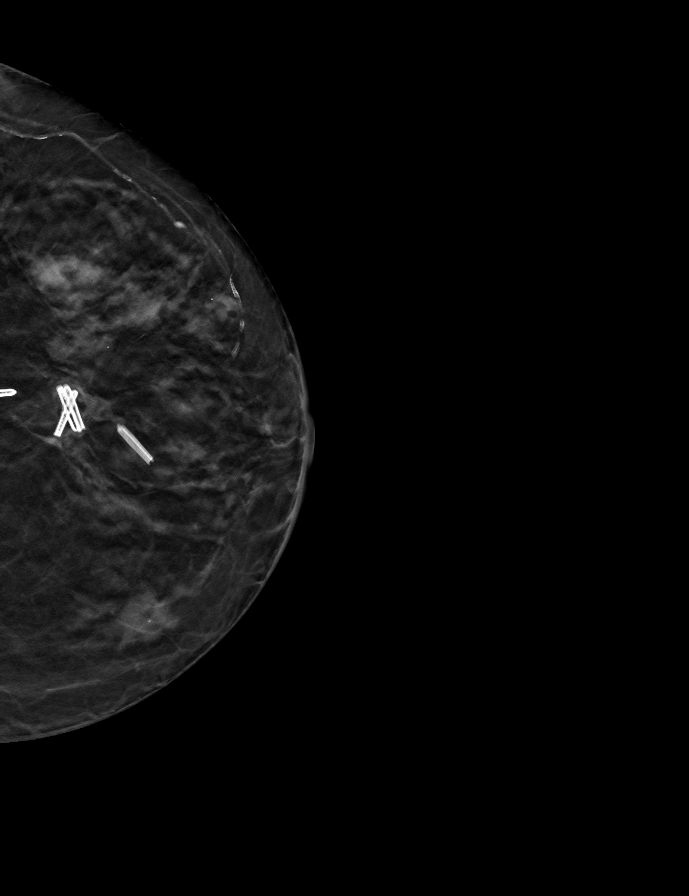

[R CC tomo · tomo slice 26/51.0]
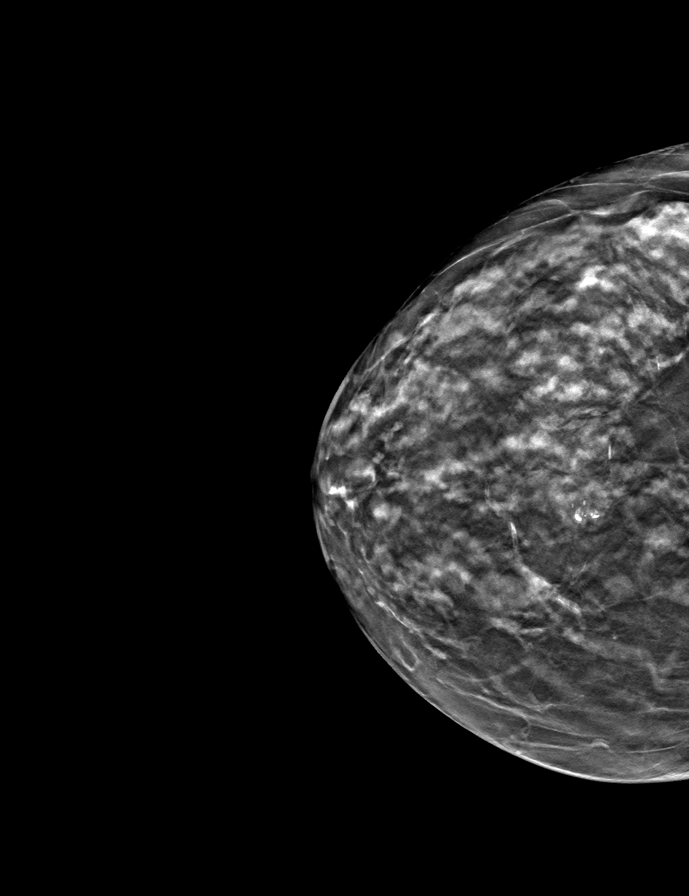

[L MLO tomo · tomo slice 25/50.0]
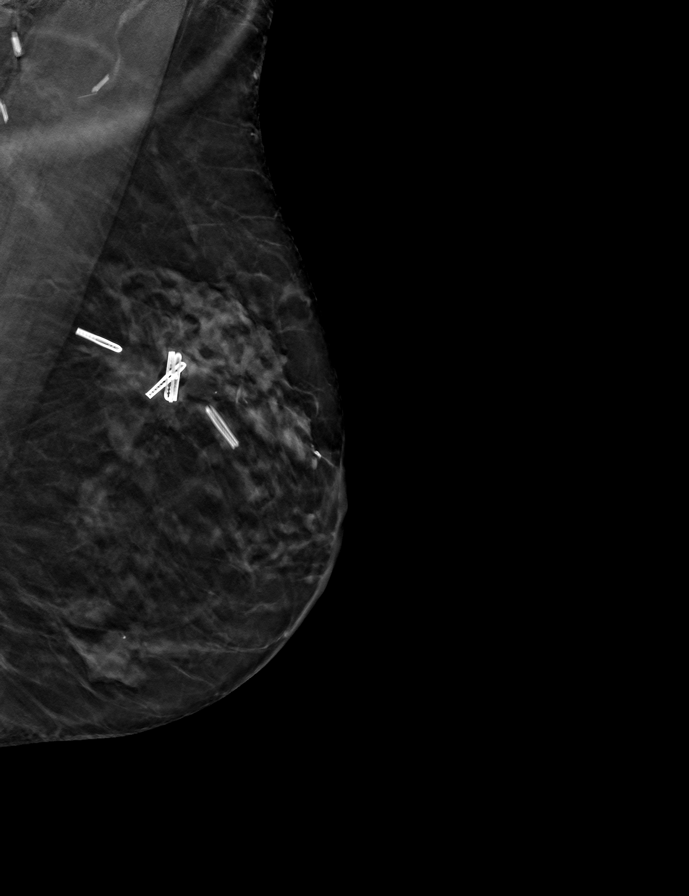

[R MLO tomo · tomo slice 25/49.0]
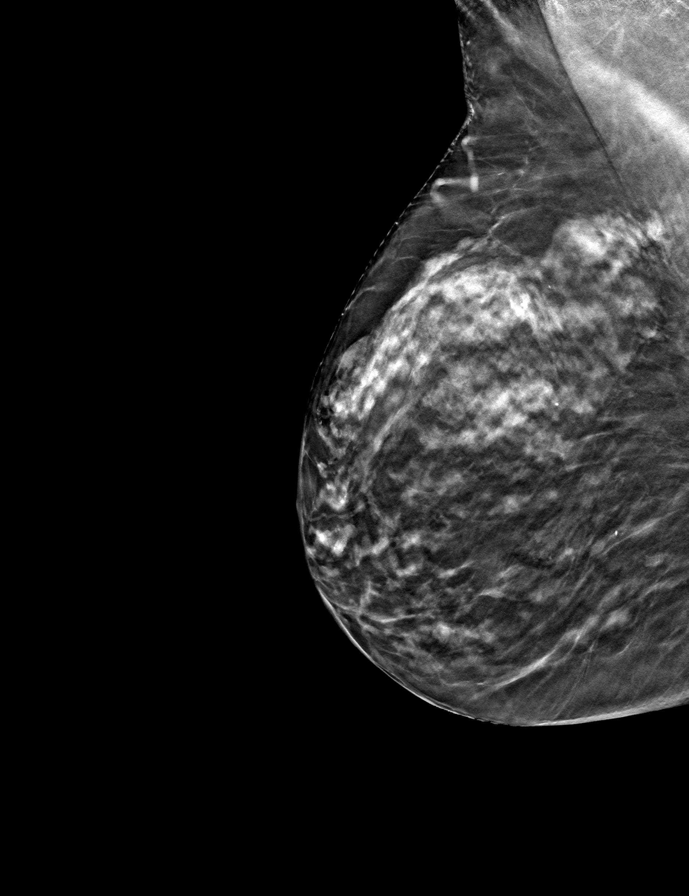

[9 of 24 positions shown; findings below may reference images not displayed]

ACR Breast Density Category c: The breast tissue is heterogeneously
dense, which may obscure small masses.
FINDINGS: There are no findings suspicious for malignancy.
IMPRESSION: No mammographic evidence of malignancy. A result letter of this
screening mammogram will be mailed directly to the patient.

RECOMMENDATION:
Screening mammogram in one year. (Code:Q3-W-BC3)

BI-RADS CATEGORY  1: Negative.

## 2023-11-16 ENCOUNTER — Ambulatory Visit: Payer: Medicare Other

## 2023-11-17 ENCOUNTER — Ambulatory Visit
Admission: RE | Admit: 2023-11-17 | Discharge: 2023-11-17 | Disposition: A | Discharge status: Home / Self Care | Payer: Medicare Other | Source: Ambulatory Visit | Attending: Internal Medicine | Admitting: Internal Medicine

## 2023-11-17 DIAGNOSIS — Z1231 Encounter for screening mammogram for malignant neoplasm of breast: Secondary | ICD-10-CM

## 2024-06-14 ENCOUNTER — Other Ambulatory Visit: Payer: Self-pay | Admitting: Internal Medicine

## 2024-06-14 DIAGNOSIS — R059 Cough, unspecified: Secondary | ICD-10-CM

## 2024-06-19 ENCOUNTER — Ambulatory Visit
Admission: RE | Admit: 2024-06-19 | Discharge: 2024-06-19 | Disposition: A | Source: Ambulatory Visit | Attending: Internal Medicine | Admitting: Internal Medicine

## 2024-06-19 DIAGNOSIS — R059 Cough, unspecified: Secondary | ICD-10-CM

## 2024-06-26 ENCOUNTER — Other Ambulatory Visit: Payer: Self-pay | Admitting: Internal Medicine

## 2024-06-26 DIAGNOSIS — E041 Nontoxic single thyroid nodule: Secondary | ICD-10-CM

## 2024-06-26 LAB — COLOGUARD

## 2024-08-15 LAB — COLOGUARD: COLOGUARD: NEGATIVE

## 2024-09-25 ENCOUNTER — Ambulatory Visit

## 2024-09-25 VITALS — BP 164/87 | HR 74 | Temp 97.9°F | Ht 62.0 in | Wt 115.4 lb

## 2024-09-25 DIAGNOSIS — R053 Chronic cough: Secondary | ICD-10-CM | POA: Diagnosis not present

## 2024-09-25 DIAGNOSIS — K219 Gastro-esophageal reflux disease without esophagitis: Secondary | ICD-10-CM | POA: Diagnosis not present

## 2024-09-25 MED ORDER — BENZONATATE 100 MG PO CAPS: 100.0000 mg | ORAL_CAPSULE | Freq: Four times a day (QID) | ORAL | 1 refills | Status: AC | PRN

## 2024-09-25 MED ORDER — OMEPRAZOLE 20 MG PO CPDR: 20.0000 mg | DELAYED_RELEASE_CAPSULE | Freq: Two times a day (BID) | ORAL | 11 refills | Status: AC

## 2024-09-25 NOTE — Progress Notes (Signed)
 "   Subjective:   PATIENT ID: Victoria Hanna GENDER: female DOB: October 14, 1940, MRN: [id number]   HPI Discussed the use of AI scribe software for clinical note transcription with the patient, who gave verbal consent to proceed.  History of Present Illness Victoria Hanna is an 84 year old female who presents with chronic cough and shortness of breath on exertion.  She has been experiencing a cough for the past couple of months, accompanied by mucus production. The cough is unpredictable, occurring mostly in the mornings after having a cup of coffee, and sometimes at night, waking her up to expel mucus. She uses cough drops to manage the cough in social settings, such as church, which helps prevent coughing during those times.  No wheezing, chest tightness, or worsening shortness of breath over the past few months. She experiences shortness of breath on exertion, which she attributes to her age, but it has not worsened recently. She has a history of exposure to secondhand smoke from her father, but her husband does not smoke.  She has a history of postnasal drip for which she uses a prescription nasal spray that dries up her nose and prevents daytime drips. She does not have any known allergies or seasonal allergies and does not have any pets at home.  Her past medical history includes a foot injury from years ago, which required surgery and resulted in the fusion of her big toe. This limits her ability to take long walks, although she remains active with a trainer. She does not report any restrictions in activities due to breathing issues.     Past Medical History:  Diagnosis Date   Hypertension      Family History  Problem Relation Age of Onset   Breast cancer Daughter 71     Social History   Socioeconomic History   Marital status: Married    Spouse name: Not on file   Number of children: Not on file   Years of education: Not on file   Highest education level: Not on file   Occupational History   Not on file  Tobacco Use   Smoking status: Never    Passive exposure: Never   Smokeless tobacco: Never  Vaping Use   Vaping status: Never Used  Substance and Sexual Activity   Alcohol use: Yes    Alcohol/week: 5.0 standard drinks of alcohol    Types: 5 Glasses of wine per week   Drug use: Never   Sexual activity: Not on file  Other Topics Concern   Not on file  Social History Narrative   Not on file   Social Drivers of Health   Tobacco Use: Low Risk (09/25/2024)   Patient History    Smoking Tobacco Use: Never    Smokeless Tobacco Use: Never    Passive Exposure: Never  Financial Resource Strain: Low Risk  (10/07/2021)   Received from [facility]   Overall Financial Resource Strain (CARDIA)    Difficulty of Paying Living Expenses: Not hard at all  Food Insecurity: No Food Insecurity (10/07/2021)   Received from [facility]   Epic    Within the past 12 months, you worried that your food would run out before you got the money to buy more.: Never true    Within the past 12 months, the food you bought just didn't last and you didn't have money to get more.: Never true  Transportation Needs: No Transportation Needs (10/07/2021)   Received  from [facility] - Transportation    In the past 12 months, has lack of transportation kept you from medical appointments or from getting medications?: No    Lack of Transportation (Non-Medical): No  Physical Activity: Not on file  Stress: Not on file  Social Connections: Not on file  Intimate Partner Violence: Not on file  Depression (EYV7-0): Not on file  Alcohol Screen: Not on file  Housing: Not on file  Utilities: Not on file  Health Literacy: Not on file     Allergies[1]   Outpatient Medications Prior to Visit  Medication Sig Dispense Refill   BREO ELLIPTA 100-25 MCG/ACT AEPB Inhale 1 puff into the lungs daily.     cholecalciferol (VITAMIN D)  1000 units tablet Take 1,000 Units by mouth daily.     furosemide (LASIX) 20 MG tablet Take 20 mg by mouth daily.     telmisartan (MICARDIS) 80 MG tablet Take 80 mg by mouth daily.     telmisartan (MICARDIS) 20 MG tablet Take 10 mg by mouth daily.     vitamin C (ASCORBIC ACID) 500 MG tablet Take 500 mg by mouth daily.     No facility-administered medications prior to visit.    ROS Reviewed all systems and reported negative except as above     Objective:   Vitals:   09/25/24 0928  BP: (!) 164/87  Pulse: 74  Temp: 97.9 F (36.6 C)  TempSrc: Oral  SpO2: 99%  Weight: 115 lb 6.4 oz (52.3 kg)  Height: 5' 2 (1.575 m)    Physical Exam Physical Exam GENERAL: Appropriate to age, no acute distress. HEAD EYES EARS NOSE THROAT: Moist mucous membranes, atraumatic, normocephalic. CHEST: Clear to auscultation bilaterally, no wheezing, no crackles, no rales CARDIAC: Regular rate and rhythm, normal S1, normal S2, no murmurs, no rubs, no gallops. ABDOMEN: Soft, nontender. NEUROLOGICAL: Motor and sensation grossly intact, alert and oriented times X 3. EXTREMITIES: Warm, well perfused, no edema.     CBC    Component Value Date/Time   WBC 7.9 02/09/2018 1237   RBC 4.82 02/09/2018 1237   HGB 14.4 02/09/2018 1237   HCT 46.0 02/09/2018 1237   PLT 354 02/09/2018 1237   MCV 95.4 02/09/2018 1237   MCH 29.9 02/09/2018 1237   MCHC 31.3 (L) 02/09/2018 1237   RDW 13.0 02/09/2018 1237   LYMPHSABS 1.5 02/09/2018 1237   MONOABS 0.9 02/09/2018 1237   EOSABS 0.2 02/09/2018 1237   BASOSABS 0.0 02/09/2018 1237     Results Radiology High resolution chest CT: No evidence of emphysema. Bland scarring         Assessment & Plan:   Assessment and Plan Assessment & Plan Chronic cough likely secondary to gastroesophageal reflux disease She is less likely to have COPD.  There is no evidence of emphysema on her CT chest.  She does have some evidence of air trapping although it appears  extremely mild at this point.  Will hold off on PFTs and assess cough based on GERD diet recommendations and omeprazole .  Patient currently not complaining of any shortness of breath at all.  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 - Prescribed omeprazole  twice daily. - Provided GERD diet instructions to avoid acidic, fatty, spicy foods, chocolate, alcohol, caffeine, sugary drinks. - Advised small frequent meals, avoid eating 2-3 hours before bed, stay hydrated. - Prescribed Tessalon  Perles for cough as needed. - Scheduled follow-up in 6-8  weeks to assess cough improvement.  Gastroesophageal reflux disease Silent acid reflux suspected as cause of chronic cough. GERD diet and medication necessary. - Prescribed omeprazole  twice daily. - Provided GERD diet instructions. - Scheduled follow-up in 6-8 weeks to assess symptom improvement.        Zola Herter, MD [facility] Office: [phone]       [1] No Known Allergies  "

## 2024-09-25 NOTE — Patient Instructions (Addendum)
" °  VISIT SUMMARY: During your visit, we discussed your chronic cough and shortness of breath on exertion. We believe your cough is likely due to silent acid reflux, which can be common as we age. We have provided a treatment plan to help manage your symptoms.  YOUR PLAN: -CHRONIC COUGH: Your chronic cough is likely due to silent acid reflux, which happens when stomach acid flows back into the esophagus and irritates it. We have prescribed omeprazole  to take twice daily to reduce stomach acid. Additionally, we provided dietary instructions to avoid foods that can trigger reflux, such as acidic, fatty, and spicy foods, as well as chocolate, alcohol, caffeine, and sugary drinks. You should eat small, frequent meals and avoid eating 2-3 hours before bed. Stay hydrated. We also prescribed Tessalon  Perles to help manage your cough as needed.  -GASTROESOPHAGEAL REFLUX DISEASE (GERD): GERD is a condition where stomach acid frequently flows back into the esophagus, causing irritation. This can lead to symptoms like chronic cough. We have prescribed omeprazole  to take twice daily and provided dietary instructions to help manage your symptoms. We will follow up in 6-8 weeks to assess your improvement.  INSTRUCTIONS: Please follow the prescribed treatment plan and dietary instructions. Take omeprazole  twice daily and use Tessalon  Perles as needed for your cough. Avoid foods that can trigger reflux and eat small, frequent meals. Avoid eating 2-3 hours before bed and stay hydrated. We will see you again in 6-8 weeks to check on your progress.    GERD DIET instructions   Foods to avoid  Acidic foods: Citrus fruits, tomatoes, tomato-based products, vinegar, garlic, onions  Fatty foods: Fried foods, fatty meats, whole milk, cheese  Spicy foods: Chili peppers, black pepper, mustard  Chocolate: Contains caffeine and theobromine, which relax the lower esophageal sphincter (LES)  Alcohol: Relaxes the LES and increases  stomach acid production  Caffeine: Found in coffee, tea, and some sodas, it can stimulate stomach acid production    Foods to Eat:  Lean proteins: Chicken, fish, eggs Non-acidic fruits: Apples, bananas, pears, grapes Vegetables: Steamed, roasted, or boiled vegetables (e.g., broccoli, carrots, green beans) Whole grains: Brown rice, quinoa, oatmeal Low-fat dairy: Skim milk, low-fat yogurt, low-fat cheese Water: Staying hydrated helps dilute stomach acid    Other Dietary Recommendations: Eat smaller, more frequent meals. Avoid eating within 2-3 hours of bedtime. Chew food thoroughly. Limit sugary drinks and processed foods. Consider a Mediterranean-style diet, which is rich in fruits, vegetables, and whole grains.     "

## 2024-12-19 ENCOUNTER — Ambulatory Visit
